# Patient Record
Sex: Female | Born: 1980 | Hispanic: No | Smoking: Never smoker
Health system: Southern US, Community
[De-identification: ages and names within clinical notes are randomized; demographics above are authoritative.]

## PROBLEM LIST (undated history)

## (undated) DIAGNOSIS — N946 Dysmenorrhea, unspecified: Secondary | ICD-10-CM

## (undated) DIAGNOSIS — E079 Disorder of thyroid, unspecified: Secondary | ICD-10-CM

## (undated) DIAGNOSIS — E785 Hyperlipidemia, unspecified: Secondary | ICD-10-CM

## (undated) DIAGNOSIS — C801 Malignant (primary) neoplasm, unspecified: Secondary | ICD-10-CM

## (undated) HISTORY — PX: WISDOM TOOTH EXTRACTION: SHX21

## (undated) HISTORY — DX: Malignant (primary) neoplasm, unspecified: C80.1

## (undated) HISTORY — PX: COLPOSCOPY: SHX161

## (undated) HISTORY — DX: Hyperlipidemia, unspecified: E78.5

## (undated) HISTORY — PX: CERVICAL BIOPSY  W/ LOOP ELECTRODE EXCISION: SUR135

## (undated) HISTORY — PX: OTHER SURGICAL HISTORY: SHX169

## (undated) HISTORY — DX: Disorder of thyroid, unspecified: E07.9

## (undated) HISTORY — DX: Dysmenorrhea, unspecified: N94.6

## (undated) HISTORY — PX: DILATION AND CURETTAGE OF UTERUS: SHX78

---

## 2018-07-13 DIAGNOSIS — R928 Other abnormal and inconclusive findings on diagnostic imaging of breast: Secondary | ICD-10-CM | POA: Diagnosis not present

## 2018-07-13 DIAGNOSIS — N6313 Unspecified lump in the right breast, lower outer quadrant: Secondary | ICD-10-CM | POA: Diagnosis not present

## 2018-07-13 DIAGNOSIS — Z6841 Body Mass Index (BMI) 40.0 and over, adult: Secondary | ICD-10-CM | POA: Diagnosis not present

## 2018-07-13 DIAGNOSIS — N6310 Unspecified lump in the right breast, unspecified quadrant: Secondary | ICD-10-CM | POA: Diagnosis not present

## 2018-07-13 DIAGNOSIS — E669 Obesity, unspecified: Secondary | ICD-10-CM | POA: Diagnosis not present

## 2018-07-13 DIAGNOSIS — N6311 Unspecified lump in the right breast, upper outer quadrant: Secondary | ICD-10-CM | POA: Diagnosis not present

## 2018-07-13 DIAGNOSIS — N6312 Unspecified lump in the right breast, upper inner quadrant: Secondary | ICD-10-CM | POA: Diagnosis not present

## 2018-07-13 DIAGNOSIS — Z139 Encounter for screening, unspecified: Secondary | ICD-10-CM | POA: Diagnosis not present

## 2018-07-14 LAB — HM MAMMOGRAPHY

## 2018-07-20 DIAGNOSIS — Z6841 Body Mass Index (BMI) 40.0 and over, adult: Secondary | ICD-10-CM | POA: Diagnosis not present

## 2018-07-20 DIAGNOSIS — B373 Candidiasis of vulva and vagina: Secondary | ICD-10-CM | POA: Diagnosis not present

## 2018-07-20 DIAGNOSIS — Z124 Encounter for screening for malignant neoplasm of cervix: Secondary | ICD-10-CM | POA: Diagnosis not present

## 2018-07-20 DIAGNOSIS — E669 Obesity, unspecified: Secondary | ICD-10-CM | POA: Diagnosis not present

## 2018-07-20 DIAGNOSIS — Z01411 Encounter for gynecological examination (general) (routine) with abnormal findings: Secondary | ICD-10-CM | POA: Diagnosis not present

## 2018-07-20 DIAGNOSIS — E039 Hypothyroidism, unspecified: Secondary | ICD-10-CM | POA: Diagnosis not present

## 2018-07-20 DIAGNOSIS — R5383 Other fatigue: Secondary | ICD-10-CM | POA: Diagnosis not present

## 2018-07-20 DIAGNOSIS — Z3009 Encounter for other general counseling and advice on contraception: Secondary | ICD-10-CM | POA: Diagnosis not present

## 2018-07-20 DIAGNOSIS — Z139 Encounter for screening, unspecified: Secondary | ICD-10-CM | POA: Diagnosis not present

## 2018-07-28 LAB — HM PAP SMEAR: HM Pap smear: NEGATIVE

## 2018-09-12 ENCOUNTER — Other Ambulatory Visit: Payer: Self-pay

## 2018-09-13 ENCOUNTER — Ambulatory Visit (INDEPENDENT_AMBULATORY_CARE_PROVIDER_SITE_OTHER): Payer: 59 | Admitting: Family Medicine

## 2018-09-13 ENCOUNTER — Encounter: Payer: Self-pay | Admitting: Family Medicine

## 2018-09-13 ENCOUNTER — Telehealth: Payer: Self-pay | Admitting: Obstetrics and Gynecology

## 2018-09-13 VITALS — BP 120/88 | HR 83 | Temp 98.1°F | Ht 65.0 in | Wt 257.8 lb

## 2018-09-13 DIAGNOSIS — Z8639 Personal history of other endocrine, nutritional and metabolic disease: Secondary | ICD-10-CM | POA: Diagnosis not present

## 2018-09-13 DIAGNOSIS — Z8619 Personal history of other infectious and parasitic diseases: Secondary | ICD-10-CM | POA: Diagnosis not present

## 2018-09-13 DIAGNOSIS — R4589 Other symptoms and signs involving emotional state: Secondary | ICD-10-CM

## 2018-09-13 DIAGNOSIS — E049 Nontoxic goiter, unspecified: Secondary | ICD-10-CM | POA: Diagnosis not present

## 2018-09-13 DIAGNOSIS — L292 Pruritus vulvae: Secondary | ICD-10-CM | POA: Diagnosis not present

## 2018-09-13 DIAGNOSIS — E010 Iodine-deficiency related diffuse (endemic) goiter: Secondary | ICD-10-CM | POA: Diagnosis not present

## 2018-09-13 DIAGNOSIS — F419 Anxiety disorder, unspecified: Secondary | ICD-10-CM | POA: Diagnosis not present

## 2018-09-13 DIAGNOSIS — M2550 Pain in unspecified joint: Secondary | ICD-10-CM | POA: Diagnosis not present

## 2018-09-13 DIAGNOSIS — F329 Major depressive disorder, single episode, unspecified: Secondary | ICD-10-CM | POA: Diagnosis not present

## 2018-09-13 DIAGNOSIS — R5383 Other fatigue: Secondary | ICD-10-CM | POA: Diagnosis not present

## 2018-09-13 NOTE — Progress Notes (Signed)
Tina Maldonado is a 38 y.o. female  Chief Complaint  Patient presents with  . Establish Care    est care/hx of HPV-- referral to gyno/ neck swelling fevers on and off/     HPI: Tina Maldonado is a 38 y.o. female here to establish care with our office. sheis a single mom, 36yo daughter. 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 Her previous care was with [facility] and also [facility].  She has a list of multiple concerns/complaints: 1. Pt states she was diagnosed with parotitis in 06/2017, seen at [facility] ER. Pt states her symptoms resolved but since that time she notes 1 spot on Rt and 2 on Lt under jaw/neck. She notes a "pulsitile" sensation in these areas on/off. No swelling. No pain. No fever, chills.  She thinks this is "nodal involvement".   2. Pt states she feels "flushed, feverish" at times but never has a temp.   3. + fatigue (chronic), joints pain, "bone cysts" that come/go and "get inflamed and swollen".   Symptoms are not as pronounced when she is sleeping well, eating well, getting a walk in daily. She gets >7hrs/night of sleep now. She is active at work (>10,000 steps). She does feel, and states her family members have told her they feel, she needs to better treat/manage her anxiety.   She had labs done in 06/2018 - normal thyroid, high cholesterol (not on medication), blood sugar was normal.   Specialists: GI [facility]), endocrinologist, rheumatology  Last PAP: 06/2018 (normal PAP, HPV +) but pt notes h/o abnormal in the past as well as colposcopy - pt requests referral to GYN Last colonoscopy: 2020   Past Medical History:  Diagnosis Date  . Cancer ([place])    basal cell carcinoma  . Hyperlipidemia   . Thyroid disease     Past Surgical History:  Procedure Laterality Date  . 2 colposcopy      Social History   Socioeconomic History  . Marital status: Single    Spouse name: Not on file  . Number of children: Not on file  . Years of education: Not on file  . Highest  education level: Not on file  Occupational History  . Not on file  Social Needs  . Financial resource strain: Not on file  . Food insecurity:    Worry: Not on file    Inability: Not on file  . Transportation needs:    Medical: Not on file    Non-medical: Not on file  Tobacco Use  . Smoking status: Never Smoker  . Smokeless tobacco: Never Used  Substance and Sexual Activity  . Alcohol use: Never    Frequency: Never  . Drug use: Never  . Sexual activity: Not on file  Lifestyle  . Physical activity:    Days per week: Not on file    Minutes per session: Not on file  . Stress: Not on file  Relationships  . Social connections:    Talks on phone: Not on file    Gets together: Not on file    Attends religious service: Not on file    Active member of club or organization: Not on file    Attends meetings of clubs or organizations: Not on file    Relationship status: Not on file  . Intimate partner violence:    Fear of current or ex partner: Not on file    Emotionally abused: Not on file    Physically abused: Not  on file    Forced sexual activity: Not on file  Other Topics Concern  . Not on file  Social History Narrative  . Not on file    Family History  Problem Relation Age of Onset  . Miscarriages / [place] Mother   . Thyroid disease Mother   . Thyroid disease Maternal Grandmother   . Arthritis Maternal Grandmother   . Heart disease Paternal Grandmother   . Heart disease Paternal Grandfather      Immunization History  Administered Date(s) Administered  . Influenza-Unspecified 03/28/2018  . Tdap 06/28/2016    No outpatient encounter medications on file as of 09/13/2018.   No facility-administered encounter medications on file as of 09/13/2018.      ROS: Pertinent positives and negatives noted in HPI. Remainder of ROS non-contributory    Allergies  Allergen Reactions  . Yeast Hives  . Tetanus Toxoids     Hives, swelling  . Pork Allergy Other (See  Comments)    BP 120/88   Pulse 83   Temp 98.1 F (36.7 C) (Oral)   Ht 5\' 5"  (1.651 m)   Wt 257 lb 12.8 oz (116.9 kg)   SpO2 99%   BMI 42.90 kg/m   Physical Exam  Constitutional: She is oriented to person, place, and time. She appears well-developed and well-nourished. No distress.  Neck: Neck supple. Thyromegaly present.  Cardiovascular: Normal rate and regular rhythm.  Pulmonary/Chest: Effort normal and breath sounds normal.  Lymphadenopathy:    She has no cervical adenopathy.  Neurological: She is alert and oriented to person, place, and time.  Skin: Skin is warm and dry.  Psychiatric:  Pt is tearful at times during visit      A/P:  1. History of HPV infection 2. Vulvar itching - Ambulatory referral to Gynecology  3. Fatigue, unspecified type 4. Arthralgia, unspecified joint - Ambulatory referral to Rheumatology  5. H/O thyroid cyst 6. Thyromegaly 7. H/O: hypothyroidism - Ambulatory referral to Endocrinology - US THYROID; Future  9. Anxiety 10. Depressed mood - pt does feel she has symptoms of both anxiety and depression and she feels these may contribute to or exacerbate her physical symptoms. We discussed medication options but pt would like to "think about" and "look into" a few of the meds we discussed and will f/u with me

## 2018-09-13 NOTE — Telephone Encounter (Signed)
Called patient but her voice mail is full. Could not leave a message for patient to call back to schedule a new patient doctor referral appointment with our office to see any provider for:

## 2018-09-14 ENCOUNTER — Ambulatory Visit (INDEPENDENT_AMBULATORY_CARE_PROVIDER_SITE_OTHER): Payer: 59 | Admitting: Obstetrics and Gynecology

## 2018-09-14 ENCOUNTER — Other Ambulatory Visit: Payer: Self-pay

## 2018-09-14 ENCOUNTER — Encounter: Payer: Self-pay | Admitting: Obstetrics and Gynecology

## 2018-09-14 ENCOUNTER — Telehealth: Payer: Self-pay

## 2018-09-14 VITALS — BP 118/82 | HR 76 | Ht 65.75 in | Wt 257.2 lb

## 2018-09-14 DIAGNOSIS — N763 Subacute and chronic vulvitis: Secondary | ICD-10-CM

## 2018-09-14 DIAGNOSIS — Z8742 Personal history of other diseases of the female genital tract: Secondary | ICD-10-CM

## 2018-09-14 DIAGNOSIS — L309 Dermatitis, unspecified: Secondary | ICD-10-CM | POA: Diagnosis not present

## 2018-09-14 DIAGNOSIS — Z9889 Other specified postprocedural states: Secondary | ICD-10-CM | POA: Diagnosis not present

## 2018-09-14 DIAGNOSIS — F3289 Other specified depressive episodes: Secondary | ICD-10-CM

## 2018-09-14 DIAGNOSIS — N898 Other specified noninflammatory disorders of vagina: Secondary | ICD-10-CM

## 2018-09-14 MED ORDER — BETAMETHASONE VALERATE 0.1 % EX OINT: TOPICAL_OINTMENT | CUTANEOUS | 1 refills | Status: DC

## 2018-09-14 MED ORDER — SERTRALINE HCL 50 MG PO TABS: 50.0000 mg | ORAL_TABLET | Freq: Every day | ORAL | 3 refills | Status: DC

## 2018-09-14 NOTE — Telephone Encounter (Signed)
Tina Maldonado, I placed a referral yesterday for thyroid US but pt would like to change location to [facility] in [place], [place] Phone# [phone]. Can you please update the referral? Thanks!  Tina Maldonado, pt and I discussed medication for depression/anxiety and she was going to think about it and get back to me. Message is that pt would like to try Rx. Will send Rx for zoloft 50mg  1 tab daily to pts pharm. She should f/u in 3-4 wks or sooner PRN

## 2018-09-14 NOTE — Patient Instructions (Signed)

## 2018-09-14 NOTE — Addendum Note (Signed)
Addended by: Ronnald Nian on: 09/14/2018 01:22 PM   Modules accepted: Orders

## 2018-09-14 NOTE — Telephone Encounter (Signed)
Copied from [place] [id number]. Topic: Referral - Request for Referral >> Sep 14, 2018 12:49 PM Yvette Rack wrote: Has patient seen PCP for this complaint? yes  *If NO, is insurance requiring patient see PCP for this issue before PCP can refer them? Referral for which specialty: Ultrasound Preferred provider/office: [facility] in [place], [place] Phone# [phone] Reason for referral: ultrasound - Pt stated she is also willing to try the medication that was recommended

## 2018-09-14 NOTE — Telephone Encounter (Signed)
Dr.C please advise

## 2018-09-14 NOTE — Progress Notes (Signed)
38 y.o. G1P1001 Single Other or two or more races Not Hispanic or Latino female here for a consult fro Dr Bryan Lemma for an abnormal pap and vaginal/rectal itching. The patient reports a  [facility]. She has a long h/o abnormal pap's. Has had 2 leep's, last one was in ~2018. She had a normal pap a year after her leep, then her next pap returned with hpv.  She has had the same partner x 6 years. She thinks he has been faithful, they live together. She reports negative STD testing. Not planning on staying with her partner. Bad relationship.  She c/o a several year history itching and irritation around her vagina and the perianal area. She has seen a dermatologist, was treated with a steroid cream for 2 weeks at a time. She does get sweaty. No vaginal d/c, has had yeast infections in the past.  She has tried baby skin care products, nothing has helped.    She would like to have another child in the future.  She had the gardasil series ~2009.    She met with a new Primary yesterday. She had an enlarged thyroid, will need to f/u with Endocrinology. Prior h/o hypothyroidism, hasn't been on medication for years. She does have a h/o thyroid cysts.   She c/o depression, primary has recommended medication, she is considering.    Period Cycle (Days): 28 Period Duration (Days): 4-5 days Period Pattern: Regular Menstrual Flow: Light, Moderate Menstrual Control: Thin pad, Tampon Menstrual Control Change Freq (Hours): changes tampon/pad every 4 hours Dysmenorrhea: (!) Mild Dysmenorrhea Symptoms: Cramping  Patient's last menstrual period was 07/30/2018 (exact date).          Sexually active: Yes.    The current method of family planning is partner with vasectomy.   Exercising: No.  The patient does not participate in regular exercise at present. Smoker:  no  Health Maintenance: Pap:  08/19/2018 WNL with positive HPV History of abnormal Pap:  Yes, colposcopy and  LEEP MMG:  07/2018 WNL Colonoscopy:10/14/2017 WNL TDAP: 2018 Gardasil: completed all 3    reports that she has never smoked. She has never used smokeless tobacco. She reports that she does not drink alcohol or use drugs. Daughter is 74. She is an Geologist, engineering at [employer]. Daughter is 38, staying with her Grandparents ~2 hours away with the Covid 19 outbreak.   Past Medical History:  Diagnosis Date  . Cancer ([place])    basal cell carcinoma  . Dysmenorrhea   . Hyperlipidemia   . Thyroid disease     Past Surgical History:  Procedure Laterality Date  . 2 colposcopy    . CERVICAL BIOPSY  W/ LOOP ELECTRODE EXCISION    . COLPOSCOPY    . DILATION AND CURETTAGE OF UTERUS    . WISDOM TOOTH EXTRACTION      Current Outpatient Medications  Medication Sig Dispense Refill  . cetirizine (ZYRTEC) 10 MG tablet Take 10 mg by mouth as needed for allergies.    . naproxen sodium (ALEVE) 220 MG tablet Take 220 mg by mouth daily as needed.    . OIL OF OREGANO PO Take by mouth daily.     No current facility-administered medications for this visit.     Family History  Problem Relation Age of Onset  . Miscarriages / [place] Mother   . Thyroid disease Mother   . Thyroid disease Maternal Grandmother   . Arthritis Maternal Grandmother   . Heart  disease Paternal Grandmother   . Heart disease Paternal Grandfather     Review of Systems  Constitutional: Negative.   HENT: Negative.   Eyes: Negative.   Respiratory: Negative.   Cardiovascular: Negative.   Gastrointestinal:       Rectal itching  Endocrine: Negative.   Genitourinary:       Vaginal itching  Musculoskeletal: Negative.   Skin: Negative.   Allergic/Immunologic: Negative.   Neurological: Negative.   Hematological: Negative.   Psychiatric/Behavioral: Negative.     Exam:   BP 118/82 (BP Location: Right Arm, Patient Position: Sitting, Cuff Size: Large)   Pulse 76   Ht 5' 5.75" (1.67 m)   Wt 257 lb 3.2 oz (116.7 kg)   LMP 07/30/2018  (Exact Date)   BMI 41.83 kg/m   Weight change: _0 @ Height:   Height: 5' 5.75" (167 cm)  Ht Readings from Last 3 Encounters:  09/14/18 5' 5.75" (1.67 m)  09/13/18 _1  (1.651 m)    General appearance: alert, cooperative and appears stated age Tearful throughout the visit   Pelvic: External genitalia:  no lesions, slight erythema on the lateral vulva.              Urethra:  normal appearing urethra with no masses, tenderness or lesions              Bartholins and Skenes: normal                 Vagina: normal appearing vagina with normal color, there is a slight increase in watery vaginal discharge              Cervix: no lesions               Bimanual Exam:  Uterus:  normal size, contour, position, consistency, mobility, non-tender              Adnexa: no mass, fullness, tenderness   Perianal region with whitening, erythema, c/w lichen simplex chronicus                 Chaperone was present for exam.  A:  Abnormal pap  Vulvitis   Perianal dermatitis  Depression  P:   Will get a copy of her LEEP pathology, and her pap's since then. I suspect she will need a colposcopy  Affirm sent  Discussed vulvar and perianal skin care  Steroid ointment, use BID for up to 2 weeks, then can use up to 2 x a week  Discussed depression, name of counselor given  Her primary discussed antidepressant's with her, I encouraged her to speak with her primary again. I think she would benefit from medication.   Over 40 minutes spent with the patient, over 50% in counseling  CC: Dr Bryan Lemma Note sent

## 2018-09-14 NOTE — Telephone Encounter (Signed)
Left pt VM to call back, need to inform of rx being sent to pharmacy on file. Also, Need to inform pt that she should f/u in 3-4 weeks after taking the medication or sooner PRN.

## 2018-09-15 LAB — VAGINITIS/VAGINOSIS, DNA PROBE
Candida Species: POSITIVE — AB
Gardnerella vaginalis: NEGATIVE
Trichomonas vaginosis: NEGATIVE

## 2018-09-18 ENCOUNTER — Telehealth: Payer: Self-pay | Admitting: Emergency Medicine

## 2018-09-18 MED ORDER — FLUCONAZOLE 150 MG PO TABS: ORAL_TABLET | ORAL | 0 refills | Status: DC

## 2018-09-18 NOTE — Telephone Encounter (Signed)
-----   Message from Salvadore Dom, MD sent at 09/15/2018  1:44 PM EDT ----- Please inform the patient that her vaginitis panel returned with yeast and treat with diflucan 150 mg x 1, may repeat in 72 hours if still symptomatic. #2, no refills

## 2018-09-18 NOTE — Telephone Encounter (Signed)
Message left to return call to [place] at [phone].   Diflucan sent in to [facility], [place], [place]   Mychart message sent as well.

## 2018-09-21 NOTE — Telephone Encounter (Signed)
Sent pt mychart message to inform her about rx sent and that we need to make a f/u appoint in 3-4 weeks.

## 2018-10-04 ENCOUNTER — Encounter: Payer: Self-pay | Admitting: Family Medicine

## 2018-10-13 ENCOUNTER — Ambulatory Visit
Admission: RE | Admit: 2018-10-13 | Discharge: 2018-10-13 | Disposition: A | Discharge status: Home / Self Care | Payer: 59 | Source: Ambulatory Visit | Attending: Family Medicine | Admitting: Family Medicine

## 2018-10-13 ENCOUNTER — Other Ambulatory Visit: Payer: Self-pay

## 2018-10-13 DIAGNOSIS — E041 Nontoxic single thyroid nodule: Secondary | ICD-10-CM | POA: Diagnosis not present

## 2018-10-13 DIAGNOSIS — Z8639 Personal history of other endocrine, nutritional and metabolic disease: Secondary | ICD-10-CM

## 2018-10-13 DIAGNOSIS — E042 Nontoxic multinodular goiter: Secondary | ICD-10-CM | POA: Diagnosis not present

## 2018-10-13 DIAGNOSIS — E049 Nontoxic goiter, unspecified: Secondary | ICD-10-CM

## 2018-10-19 ENCOUNTER — Encounter: Payer: Self-pay | Admitting: Family Medicine

## 2018-11-06 ENCOUNTER — Other Ambulatory Visit: Payer: Self-pay

## 2018-11-06 ENCOUNTER — Ambulatory Visit (INDEPENDENT_AMBULATORY_CARE_PROVIDER_SITE_OTHER): Payer: 59 | Admitting: Internal Medicine

## 2018-11-06 ENCOUNTER — Encounter: Payer: Self-pay | Admitting: Internal Medicine

## 2018-11-06 ENCOUNTER — Telehealth: Payer: Self-pay | Admitting: Internal Medicine

## 2018-11-06 VITALS — BP 126/70 | HR 94 | Temp 98.2°F | Ht 65.0 in | Wt 260.4 lb

## 2018-11-06 DIAGNOSIS — Z6841 Body Mass Index (BMI) 40.0 and over, adult: Secondary | ICD-10-CM

## 2018-11-06 DIAGNOSIS — R5383 Other fatigue: Secondary | ICD-10-CM

## 2018-11-06 DIAGNOSIS — E041 Nontoxic single thyroid nodule: Secondary | ICD-10-CM | POA: Diagnosis not present

## 2018-11-06 DIAGNOSIS — E559 Vitamin D deficiency, unspecified: Secondary | ICD-10-CM | POA: Insufficient documentation

## 2018-11-06 DIAGNOSIS — M2550 Pain in unspecified joint: Secondary | ICD-10-CM | POA: Insufficient documentation

## 2018-11-06 LAB — BASIC METABOLIC PANEL
BUN: 8 mg/dL (ref 6–23)
CO2: 27 mEq/L (ref 19–32)
Calcium: 9.4 mg/dL (ref 8.4–10.5)
Chloride: 104 mEq/L (ref 96–112)
Creatinine, Ser: 0.7 mg/dL (ref 0.40–1.20)
GFR: 93.58 mL/min (ref >60.00)
Glucose, Bld: 104 mg/dL — ABNORMAL HIGH (ref 70–99)
Potassium: 3.8 mEq/L (ref 3.5–5.1)
Sodium: 140 mEq/L (ref 135–145)

## 2018-11-06 LAB — T4, FREE: Free T4: 0.97 ng/dL (ref 0.60–1.60)

## 2018-11-06 LAB — TSH: TSH: 2.35 u[IU]/mL (ref 0.35–4.50)

## 2018-11-06 LAB — VITAMIN D 25 HYDROXY (VIT D DEFICIENCY, FRACTURES): VITD: 18.43 ng/mL — ABNORMAL LOW (ref 30.00–100.00)

## 2018-11-06 MED ORDER — ERGOCALCIFEROL 1.25 MG (50000 UT) PO CAPS: 50000.0000 [IU] | ORAL_CAPSULE | ORAL | 0 refills | Status: DC

## 2018-11-06 NOTE — Telephone Encounter (Signed)
Patient returning call. Please call patient at ph# [phone].

## 2018-11-06 NOTE — Progress Notes (Signed)
Name: Tina Maldonado  MRN/ DOB: [id number], [birth year]    Age/ Sex: 38 y.o., female    PCP: Ronnald Nian, DO   Reason for Endocrinology Evaluation: Thyroid nodules     Date of Initial Endocrinology Evaluation: 11/06/2018     HPI: [name] is a 38 y.o. female with a past medical history of thyroid nodules.  The patient presented for initial endocrinology clinic visit on 11/06/2018 for consultative assistance with her Thyroid nodules.   She has been diagnosed with thyroid nodules ~ 10 yrs ago. She is S/P FNA ~ in 2015 with benign cytology per patient. She does not recall where she had the FNA done at.   She was also diagnosed with Hashimoto's thyroiditis and was on Levothyroxine at some point, but stopped it years ago.   She complaints of fatigue, she sleeps 8 hrs at night but wakes up tired, she admits to snoring. She attributes weight gain to poor lifestyle choices and lack of physical activity.   She does admit to depression, she has not started sertraline, she was tearful in the office today.     Works as Chartered loss adjuster     HISTORY:  Past Medical History:  Past Medical History:  Diagnosis Date  . Cancer ([place])    basal cell carcinoma  . Dysmenorrhea   . Hyperlipidemia   . Thyroid disease    Past Surgical History:  Past Surgical History:  Procedure Laterality Date  . 2 colposcopy    . CERVICAL BIOPSY  W/ LOOP ELECTRODE EXCISION    . COLPOSCOPY    . DILATION AND CURETTAGE OF UTERUS    . WISDOM TOOTH EXTRACTION        Social History:  reports that she has never smoked. She has never used smokeless tobacco. She reports that she does not drink alcohol or use drugs.  Family History: family history includes Arthritis in her maternal grandmother; Heart disease in her paternal grandfather and paternal grandmother; Miscarriages / [place] in her mother; Thyroid disease in her maternal grandmother and mother.   HOME MEDICATIONS: Allergies as of  11/06/2018      Reactions   Yeast Hives   Tetanus Toxoids    Hives, swelling   Pork Allergy Other (See Comments)      Medication List       Accurate as of Nov 06, 2018  1:21 PM. If you have any questions, ask your nurse or doctor.        betamethasone valerate ointment 0.1 % Commonly known as:  VALISONE Use a small amount BID for up to 2 weeks as needed. Not for daily use. You can use it 2 x a week as maintenance   cetirizine 10 MG tablet Commonly known as:  ZYRTEC Take 10 mg by mouth as needed for allergies.   ergocalciferol 1.25 MG (50000 UT) capsule Commonly known as:  VITAMIN D2 Take 1 capsule (50,000 Units total) by mouth once a week. Started by:  Dorita Sciara, MD   fluconazole 150 MG tablet Commonly known as:  DIFLUCAN Take one tablet po today and one tablet po in 72 hours if symptoms remain.   OIL OF OREGANO PO Take by mouth daily.   sertraline 50 MG tablet Commonly known as:  ZOLOFT Take 1 tablet (50 mg total) by mouth daily.         REVIEW OF SYSTEMS: A comprehensive ROS was conducted with the patient and is negative except as per  HPI and below:  Review of Systems  Constitutional: Positive for malaise/fatigue. Negative for weight loss.  HENT: Negative for congestion and sore throat.   Eyes: Negative for blurred vision and pain.  Respiratory: Negative for cough and shortness of breath.   Cardiovascular: Negative for chest pain and palpitations.  Gastrointestinal: Negative for constipation and nausea.  Genitourinary: Negative for frequency.  Musculoskeletal: Positive for joint pain.  Neurological: Positive for tingling. Negative for tremors.  Endo/Heme/Allergies: Positive for polydipsia.  Psychiatric/Behavioral: Positive for depression. The patient is not nervous/anxious.        OBJECTIVE:  VS: BP 126/70 (BP Location: Left Arm, Patient Position: Sitting, Cuff Size: Normal)   Pulse 94   Temp 98.2 F (36.8 C) (Oral)   Wt 260 lb 6.4 oz  (118.1 kg)   SpO2 98%   BMI 42.35 kg/m    Wt Readings from Last 3 Encounters:  11/06/18 260 lb 6.4 oz (118.1 kg)  09/14/18 257 lb 3.2 oz (116.7 kg)  09/13/18 257 lb 12.8 oz (116.9 kg)     EXAM: General: Pt appears well , teary eyed during the visit   Hydration: Well-hydrated with moist mucous membranes and good skin turgor  Eyes: External eye exam normal without stare, lid lag or exophthalmos.  EOM intact.  PERRL.  Ears, Nose, Throat: Hearing: Grossly intact bilaterally Dental: Good dentition  Throat: Clear without mass, erythema or exudate  Neck: General: Supple without adenopathy. Thyroid: Thyroid size normal.  No goiter or nodules appreciated. No thyroid bruit.  Lungs: Clear with good BS bilat with no rales, rhonchi, or wheezes  Heart: Auscultation: RRR.  Abdomen: Normoactive bowel sounds, soft, nontender, without masses or organomegaly palpable  Extremities:  BL LE: No pretibial edema normal ROM and strength.  Skin: Hair: Texture and amount normal with gender appropriate distribution Skin Inspection: Pt with malar rash , no acanthosis nigricans/skin tags. Pt with pale but wide abdominal wall striae. Skin Palpation: Skin temperature, texture, and thickness normal to palpation  Neuro: Cranial nerves: II - XII grossly intact  Motor: Normal strength throughout DTRs: 2+ and symmetric in UE without delay in relaxation phase  Mental Status: Judgment, insight: Intact Orientation: Oriented to time, place, and person Mood and affect: pt tearing during certain conversation      DATA REVIEWED: Results for ABRINA, PETZ (MRN [id number]) as of 11/06/2018 13:19  Ref. Range 11/06/2018 09:01  Sodium Latest Ref Range: 135 - 145 mEq/L 140  Potassium Latest Ref Range: 3.5 - 5.1 mEq/L 3.8  Chloride Latest Ref Range: 96 - 112 mEq/L 104  CO2 Latest Ref Range: 19 - 32 mEq/L 27  Glucose Latest Ref Range: 70 - 99 mg/dL 104 (H)  BUN Latest Ref Range: 6 - 23 mg/dL 8  Creatinine Latest Ref Range:  0.40 - 1.20 mg/dL 0.70  Calcium Latest Ref Range: 8.4 - 10.5 mg/dL 9.4  GFR Latest Ref Range: >60.00 mL/min 93.58  VITD Latest Ref Range: 30.00 - 100.00 ng/mL 18.43 (L)   Results for JEMIMAH, CRESSY (MRN [id number]) as of 11/06/2018 13:19  Ref. Range 11/06/2018 09:01  TSH Latest Ref Range: 0.35 - 4.50 uIU/mL 2.35  T4,Free(Direct) Latest Ref Range: 0.60 - 1.60 ng/dL 0.97      Thyroid Ultrasound 10/13/2018  Right mid thyroid nodule meets criteria for surveillance, as designated by the newly established ACR TI-RADS criteria. Surveillance ultrasound study recommended to be performed annually up to 5 years.  ASSESSMENT/PLAN/RECOMMENDATIONS:   1. Thyroid Nodule :   - Denies local  neck symptoms.  - Pt is biochemically euthyroid - Pt with multiple non-specific symptoms , that are NOT attributed to her thyroid  - Discussed thyroid nodules are 95 % benign.  - Discussed the need for repeat ultrasound in a year of the right superior nodule - Pt has a hx of FNA in 2015 with benign cytology, she does not recall which nodule was biopsied or where it was done.     2. Arthralagia :   - She has been referred to rheumatology by her PCP  - Will check Vitamin D levels today    3. Fatigue :   - This is multifactorial given depression, vitamin d deficiency, OSA. - Phenotypically I have a high suspicion for possible obstructive sleep apnea - I have asked her to address this with her PCP for further evaluation   4. Obesity :   - Pt attributes this to poor lifestyle choices. I have encouraged her to exercise (brisk walking ) for ~ 170 min/week - She may use "myfitness pal" to count calories - If interested she may see Dr. Leafy Ro for medical weight management.  - We also discussed screening for cushing syndrome, but again she believes this is lifestyle, will revisit this in the future.    5.Depression :   - She has not started Sertraline yet. I have advised her to start antidepressants, as  this could improve her motivation, I also assured her that anti-depressants are not a life sentence she could discuss tapering down with her PCP after 6-9 months .   6. Vitamin D deficiency :   Will replenish with Vitamin D 50,000 iu weekly for 3 months, after that she can go to OTC D3 daily 2000 iu daily.     F/u in  1 yr.   Signed electronically by: Mack Guise, MD  [facility] Endocrinology  [facility] [address] [place], [place] [address] Phone: [phone] FAX: [phone]   CC: Ronnald Nian, [place] [address] Phone: [phone] Fax: [phone]   Return to Endocrinology clinic as below: Future Appointments  Date Time Provider [place]  11/06/2019  8:10 AM Storie Heffern, Melanie Crazier, MD [facility] None

## 2018-11-07 LAB — THYROID PEROXIDASE ANTIBODY: Thyroperoxidase Ab SerPl-aCnc: 44 IU/mL — ABNORMAL HIGH (ref <9)

## 2018-11-08 NOTE — Telephone Encounter (Signed)
Nothing further needed 

## 2019-04-22 ENCOUNTER — Encounter (HOSPITAL_COMMUNITY): Payer: Self-pay | Admitting: Emergency Medicine

## 2019-04-22 ENCOUNTER — Emergency Department (HOSPITAL_COMMUNITY)
Admission: EM | Admit: 2019-04-22 | Discharge: 2019-04-22 | Disposition: A | Discharge status: Home / Self Care | Payer: PRIVATE HEALTH INSURANCE | Attending: Emergency Medicine | Admitting: Emergency Medicine

## 2019-04-22 ENCOUNTER — Other Ambulatory Visit: Payer: Self-pay

## 2019-04-22 DIAGNOSIS — Z79899 Other long term (current) drug therapy: Secondary | ICD-10-CM | POA: Diagnosis not present

## 2019-04-22 DIAGNOSIS — M545 Low back pain, unspecified: Secondary | ICD-10-CM

## 2019-04-22 DIAGNOSIS — E785 Hyperlipidemia, unspecified: Secondary | ICD-10-CM | POA: Diagnosis not present

## 2019-04-22 DIAGNOSIS — Z887 Allergy status to serum and vaccine status: Secondary | ICD-10-CM | POA: Insufficient documentation

## 2019-04-22 DIAGNOSIS — E079 Disorder of thyroid, unspecified: Secondary | ICD-10-CM | POA: Insufficient documentation

## 2019-04-22 DIAGNOSIS — Z91018 Allergy to other foods: Secondary | ICD-10-CM | POA: Diagnosis not present

## 2019-04-22 MED ORDER — LIDOCAINE 5 % EX PTCH
1.0000 | MEDICATED_PATCH | CUTANEOUS | Status: DC
  Administered 2019-04-22: 1 via TRANSDERMAL
  Filled 2019-04-22: qty 1

## 2019-04-22 MED ORDER — METHOCARBAMOL 500 MG PO TABS: 500.0000 mg | ORAL_TABLET | Freq: Two times a day (BID) | ORAL | 0 refills | Status: DC

## 2019-04-22 MED ORDER — METHOCARBAMOL 500 MG PO TABS
500.0000 mg | ORAL_TABLET | Freq: Once | ORAL | Status: AC
  Administered 2019-04-22: 500 mg via ORAL
  Filled 2019-04-22: qty 1

## 2019-04-22 MED ORDER — IBUPROFEN 800 MG PO TABS
800.0000 mg | ORAL_TABLET | Freq: Once | ORAL | Status: AC
  Administered 2019-04-22: 800 mg via ORAL
  Filled 2019-04-22: qty 1

## 2019-04-22 NOTE — Discharge Instructions (Signed)
You were seen here today for Back Pain: Low back pain is discomfort in the lower back that may be due to injuries to muscles and ligaments around the spine. Occasionally, it may be caused by a problem to a part of the spine called a disc. Your back pain should be treated with medicines listed below as well as back exercises and this back pain should get better over the next 2 weeks. Most patients get completely well in 4 weeks. It is important to know however, if you develop severe or worsening pain, low back pain with fever, numbness, weakness or inability to walk or urinate, you should return to the ER immediately.  Please follow up with your doctor this week for a recheck if still having symptoms.  HOME INSTRUCTIONS Self - care:  The application of heat can help soothe the pain.  Maintaining your daily activities, including walking (this is encouraged), as it will help you get better faster than just staying in bed. Do not life, push, pull anything more than 10 pounds for the next week. I am attaching back exercises that you can do at home to help facilitate your recovery.   Back Exercises - I have attached a handout on back exercises that can be done at home to help facilitate your recovery.   Medications are also useful to help with pain control.   Acetaminophen.  This medication is generally safe, and found over the counter. Take as directed for your age. You should not take more than 8 of the extra strength (500mg ) pills a day (max dose is 4000mg  total OVER one day)  Non steroidal anti inflammatory: This includes medications including Ibuprofen, naproxen and Mobic; These medications help both pain and swelling and are very useful in treating back pain.  They should be taken with food, as they can cause stomach upset, and more seriously, stomach bleeding. Do not combine the medications.   Lidocaine Patch: Salon Pas lidocaine patches (blue and silver box) can be purchased over the counter and worn  for 12 hours for local pain relief   Muscle relaxants:  These medications can help with muscle tightness that is a cause of lower back pain.  Most of these medications can cause drowsiness, and it is not safe to drive or use dangerous machinery while taking them. They are primarily helpful when taken at night before sleep.  You will need to follow up with your primary healthcare provider in 1-2 weeks for reassessment and persistent symptoms.  Be aware that if you develop new symptoms, such as a fever, leg weakness, difficulty with or loss of control of your urine or bowels, abdominal pain, or more severe pain, you will need to seek medical attention and/or return to the Emergency department. Additional Information:  Your vital signs today were: BP (!) 162/97    Pulse (!) 115    Temp 98.6 F (37 C) (Oral)    Resp 18    LMP 04/22/2019    SpO2 97%  If your blood pressure (BP) was elevated above 135/85 this visit, please have this repeated by your doctor within one month. ---------------

## 2019-04-22 NOTE — ED Provider Notes (Signed)
[facility], [place] EMERGENCY DEPARTMENT Provider Note   CSN: [id number] Arrival date & time: 04/22/19  1946     History   Chief Complaint Chief Complaint  Patient presents with   Back Pain    HPI Tina Maldonado is a 38 y.o. female.     Tina Maldonado is a 38 y.o. female with a history of hyperlipidemia, thyroid disease, who presents to the ED for evaluation of low back pain.  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  She reports pain starts in the middle and radiates out bilaterally.  Pain does not extend into the legs.  She denies any associated numbness, weakness or tingling.  No loss of bowel or bladder control or saddle anesthesia.  Denies previous history of back injury or surgery.  No history of IV drug use or cancer.  No fevers or abdominal pain.  Nothing for pain prior to arrival.  No other aggravating or alleviating factors.     Past Medical History:  Diagnosis Date   Cancer ([place])    basal cell carcinoma   Dysmenorrhea    Hyperlipidemia    Thyroid disease     Patient Active Problem List   Diagnosis Date Noted   Vitamin D deficiency 11/06/2018   Class 3 severe obesity due to excess calories without serious comorbidity with body mass index (BMI) of 40.0 to 44.9 in adult [facility]) 11/06/2018   Fatigue 11/06/2018   Arthralgia 11/06/2018   Thyroid nodule 11/06/2018    Past Surgical History:  Procedure Laterality Date   2 colposcopy     CERVICAL BIOPSY  W/ LOOP ELECTRODE EXCISION     COLPOSCOPY     DILATION AND CURETTAGE OF UTERUS     WISDOM TOOTH EXTRACTION       OB History    Gravida  1   Para  1   Term  1   Preterm      AB      Living  1     SAB      TAB      Ectopic      Multiple      Live Births  1            Home Medications    Prior to Admission medications   Medication Sig Start Date End Date Taking?  Authorizing Provider  betamethasone valerate ointment (VALISONE) 0.1 % Use a small amount BID for up to 2 weeks as needed. Not for daily use. You can use it 2 x a week as maintenance Patient not taking: Reported on 11/06/2018 09/14/18   Tina Dom, MD  cetirizine (ZYRTEC) 10 MG tablet Take 10 mg by mouth as needed for allergies.    [provider]  ergocalciferol (VITAMIN D2) 1.25 MG (50000 UT) capsule Take 1 capsule (50,000 Units total) by mouth once a week. 11/06/18   Tina, Melanie Crazier, MD  fluconazole (DIFLUCAN) 150 MG tablet Take one tablet po today and one tablet po in 72 hours if symptoms remain. Patient not taking: Reported on 11/06/2018 09/18/18   Tina Dom, MD  methocarbamol (ROBAXIN) 500 MG tablet Take 1 tablet (500 mg total) by mouth 2 (two) times daily. 04/22/19   Tina Larsen, PA-C  OIL OF OREGANO PO Take by mouth daily.    [provider]  sertraline (ZOLOFT) 50 MG tablet Take  1 tablet (50 mg total) by mouth daily. Patient not taking: Reported on 11/06/2018 09/14/18   Ronnald Nian, DO    Family History Family History  Problem Relation Age of Onset   5 / [place] Mother    Thyroid disease Mother    Thyroid disease Maternal Grandmother    Arthritis Maternal Grandmother    Heart disease Paternal Grandmother    Heart disease Paternal Grandfather     Social History Social History   Tobacco Use   Smoking status: Never Smoker   Smokeless tobacco: Never Used  Substance Use Topics   Alcohol use: Never    Frequency: Never   Drug use: Never     Allergies   Yeast, Tetanus toxoids, and Pork allergy   Review of Systems Review of Systems  Constitutional: Negative for chills and fever.  HENT: Negative.   Respiratory: Negative for shortness of breath.   Cardiovascular: Negative for chest pain.  Gastrointestinal: Negative for abdominal pain, constipation, diarrhea, nausea and vomiting.    Genitourinary: Negative for dysuria, flank pain, frequency and hematuria.  Musculoskeletal: Positive for back pain. Negative for arthralgias, gait problem, joint swelling, myalgias and neck pain.  Skin: Negative for color change, rash and wound.  Neurological: Negative for weakness and numbness.     Physical Exam Updated Vital Signs BP (!) 162/97    Pulse (!) 115    Temp 98.6 F (37 C) (Oral)    Resp 18    LMP 04/22/2019    SpO2 97%   Physical Exam Vitals signs and nursing note reviewed.  Constitutional:      General: She is not in acute distress.    Appearance: She is well-developed. She is not diaphoretic.  HENT:     Head: Atraumatic.  Eyes:     General:        Right eye: No discharge.        Left eye: No discharge.  Neck:     Musculoskeletal: Neck supple.  Cardiovascular:     Pulses:          Radial pulses are 2+ on the right side and 2+ on the left side.       Dorsalis pedis pulses are 2+ on the right side and 2+ on the left side.       Posterior tibial pulses are 2+ on the right side and 2+ on the left side.  Pulmonary:     Effort: Pulmonary effort is normal. No respiratory distress.  Abdominal:     General: Bowel sounds are normal. There is no distension.     Palpations: Abdomen is soft. There is no mass.     Tenderness: There is no abdominal tenderness. There is no guarding.     Comments: Abdomen soft, nondistended, nontender to palpation in all quadrants without guarding or peritoneal signs, no CVA tenderness bilaterally  Musculoskeletal:     Comments: Tenderness to palpation over low back starting at midline and radiating out.  No overlying skin changes or palpable deformity.  Pain made worse with range of motion of the lower extremities  Skin:    General: Skin is warm and dry.     Capillary Refill: Capillary refill takes less than 2 seconds.  Neurological:     Mental Status: She is alert and oriented to person, place, and time.     Comments: Alert, clear  speech, following commands. Moving all extremities without difficulty. Bilateral lower extremities with 5/5 strength in proximal and distal muscle groups  and with dorsi and plantar flexion. Sensation intact in bilateral lower extremities. 2+ patellar DTRs bilaterally. Ambulatory with steady gait  Psychiatric:        Behavior: Behavior normal.      ED Treatments / Results  Labs (all labs ordered are listed, but only abnormal results are displayed) Labs Reviewed - No data to display  EKG None  Radiology No results found.  Procedures Procedures (including critical care time)  Medications Ordered in ED Medications  lidocaine (LIDODERM) 5 % 1 patch (1 patch Transdermal Patch Applied 04/22/19 2043)  ibuprofen (ADVIL) tablet 800 mg (800 mg Oral Given 04/22/19 2043)  methocarbamol (ROBAXIN) tablet 500 mg (500 mg Oral Given 04/22/19 2042)     Initial Impression / Assessment and Plan / ED Course  I have reviewed the triage vital signs and the nursing notes.  Pertinent labs & imaging results that were available during my care of the patient were reviewed by me and considered in my medical decision making (see chart for details).  Patient with back pain.  Tachycardic on arrival but heart rate returned to normal range with treatment on my exam.  No neurological deficits and normal neuro exam.  Patient can walk but states is painful.  No loss of bowel or bladder control.  No concern for cauda equina.  No fever, night sweats, weight loss, h/o cancer, IVDU.  Pain treated and improved in the ED.  RICE protocol and pain medicine indicated and discussed with patient.   Final Clinical Impressions(s) / ED Diagnoses   Final diagnoses:  Acute midline low back pain without sciatica    ED Discharge Orders         Ordered    methocarbamol (ROBAXIN) 500 MG tablet  2 times daily     04/22/19 2217           Tina Larsen, PA-C 04/22/19 [place], [place], DO 04/25/19 1242

## 2019-04-22 NOTE — ED Notes (Signed)
Patient verbalizes understanding of discharge instructions. Opportunity for questioning and answers were provided. Armband removed by staff, pt discharged from ED.  

## 2019-04-22 NOTE — ED Triage Notes (Signed)
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

## 2019-08-08 ENCOUNTER — Other Ambulatory Visit (HOSPITAL_COMMUNITY)
Admission: RE | Admit: 2019-08-08 | Discharge: 2019-08-08 | Disposition: A | Discharge status: Home / Self Care | Payer: Self-pay | Source: Ambulatory Visit | Attending: Obstetrics and Gynecology | Admitting: Obstetrics and Gynecology

## 2019-08-08 ENCOUNTER — Encounter: Payer: Self-pay | Admitting: Obstetrics and Gynecology

## 2019-08-08 ENCOUNTER — Ambulatory Visit: Payer: PRIVATE HEALTH INSURANCE | Admitting: Obstetrics and Gynecology

## 2019-08-08 ENCOUNTER — Other Ambulatory Visit: Payer: Self-pay

## 2019-08-08 VITALS — BP 110/70 | HR 78 | Temp 97.7°F | Resp 14 | Ht 65.0 in | Wt 241.2 lb

## 2019-08-08 DIAGNOSIS — Z113 Encounter for screening for infections with a predominantly sexual mode of transmission: Secondary | ICD-10-CM | POA: Diagnosis not present

## 2019-08-08 DIAGNOSIS — E559 Vitamin D deficiency, unspecified: Secondary | ICD-10-CM

## 2019-08-08 DIAGNOSIS — Z124 Encounter for screening for malignant neoplasm of cervix: Secondary | ICD-10-CM | POA: Insufficient documentation

## 2019-08-08 DIAGNOSIS — Z01419 Encounter for gynecological examination (general) (routine) without abnormal findings: Secondary | ICD-10-CM

## 2019-08-08 DIAGNOSIS — N763 Subacute and chronic vulvitis: Secondary | ICD-10-CM

## 2019-08-08 DIAGNOSIS — Z0000 Encounter for general adult medical examination without abnormal findings: Secondary | ICD-10-CM

## 2019-08-08 NOTE — Patient Instructions (Signed)

## 2019-08-08 NOTE — Progress Notes (Addendum)
39 y.o. G1P1001 Single Other or two or more races Not Hispanic or Latino female here for annual exam.   She broke up with her boyfriend 9 months ago, had sex with him in 12/21. Then had oral sex with a new partner 07/31/19, she then developed a sore throat.  Period Cycle (Days): 28 Period Duration (Days): 4 Period Pattern: Regular Menstrual Flow: Moderate Menstrual Control: Tampon, Maxi pad, Thin pad, Panty liner Menstrual Control Change Freq (Hours): 4 Dysmenorrhea: None  Patient's last menstrual period was 08/01/2019.          Sexually active: Yes.    The current method of family planning is vasectomy.    Exercising: Yes.    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 Smoker:  no  Health Maintenance: Pap: 07/29/19 Neg + HR HPV  History of abnormal Pap:  yes MMG: 07/14/2018 normal per patient  Colonoscopy: 2019 normal  TDaP:  2018 Gardasil: completed    reports that she has never smoked. She has never used smokeless tobacco. She reports that she does not drink alcohol or use drugs. Just occasional ETOH. She a CT and MRI tech.   Past Medical History:  Diagnosis Date  . Cancer ([place])    basal cell carcinoma  . Dysmenorrhea   . Hyperlipidemia   . Thyroid disease     Past Surgical History:  Procedure Laterality Date  . 2 colposcopy    . CERVICAL BIOPSY  W/ LOOP ELECTRODE EXCISION    . COLPOSCOPY    . DILATION AND CURETTAGE OF UTERUS    . WISDOM TOOTH EXTRACTION      Current Outpatient Medications  Medication Sig Dispense Refill  . betamethasone valerate ointment (VALISONE) 0.1 % Use a small amount BID for up to 2 weeks as needed. Not for daily use. You can use it 2 x a week as maintenance 30 g 1  . cetirizine (ZYRTEC) 10 MG tablet Take 10 mg by mouth as needed for allergies.    Marland Kitchen ergocalciferol (VITAMIN D2) 1.25 MG (50000 UT) capsule Take 1 capsule (50,000 Units total) by mouth once a week. 12 capsule 0  . methocarbamol (ROBAXIN) 500 MG tablet Take 1 tablet (500 mg total)  by mouth 2 (two) times daily. 20 tablet 0  . OIL OF OREGANO PO Take by mouth daily.    . sertraline (ZOLOFT) 50 MG tablet Take 1 tablet (50 mg total) by mouth daily. 90 tablet 3  . clindamycin (CLEOCIN) 300 MG capsule      No current facility-administered medications for this visit.    Family History  Problem Relation Age of Onset  . Miscarriages / [place] Mother   . Thyroid disease Mother   . Thyroid disease Maternal Grandmother   . Arthritis Maternal Grandmother   . Heart disease Paternal Grandmother   . Heart disease Paternal Grandfather     Review of Systems  HENT: Positive for sore throat.     Exam:   BP 110/70 (BP Location: Right Arm, Patient Position: Sitting, Cuff Size: Large)   Pulse 78   Temp 97.7 F (36.5 C) (Skin)   Resp 14   Ht 5\' 5"  (1.651 m)   Wt 241 lb 4 oz (109.4 kg)   LMP 08/01/2019   BMI 40.15 kg/m   Weight change: @WEIGHTCHANGE @ Height:   Height: 5\' 5"  (165.1 cm)  Ht Readings from Last 3 Encounters:  08/08/19 5\' 5"  (1.651 m)  11/06/18 5\' 5"  (1.651 m)  09/14/18 5' 5.75" (1.67  m)    General appearance: alert, cooperative and appears stated age Head: Normocephalic, without obvious abnormality, atraumatic Neck: no adenopathy, supple, symmetrical, trachea midline and thyroid normal to inspection and palpation Lungs: clear to auscultation bilaterally Cardiovascular: regular rate and rhythm Breasts: normal appearance, no masses or tenderness Abdomen: soft, non-tender; non distended,  no masses,  no organomegaly Extremities: extremities normal, atraumatic, no cyanosis or edema Skin: Skin color, texture, turgor normal. No rashes or lesions Lymph nodes: Cervical, supraclavicular, and axillary nodes normal. No abnormal inguinal nodes palpated Neurologic: Grossly normal   Pelvic: External genitalia:  no lesions              Urethra:  normal appearing urethra with no masses, tenderness or lesions              Bartholins and Skenes: normal                  Vagina: normal appearing vagina with normal color and discharge, no lesions              Cervix: no lesions               Bimanual Exam:  Uterus:  normal size, contour, position, consistency, mobility, non-tender              Adnexa: no mass, fullness, tenderness               Rectovaginal: Confirms               Anus:  normal sphincter tone, no lesions  Gae Dry chaperoned for the exam.  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

## 2019-08-10 LAB — CT/GC NAA, PHARYNGEAL
C TRACH RRNA NPH QL PCR: NEGATIVE
N GONORRHOEA RRNA NPH QL PCR: NEGATIVE

## 2019-08-10 LAB — COMPREHENSIVE METABOLIC PANEL
ALT: 13 IU/L (ref 0–32)
AST: 15 IU/L (ref 0–40)
Albumin/Globulin Ratio: 1.7 (ref 1.2–2.2)
Albumin: 4.5 g/dL (ref 3.8–4.8)
Alkaline Phosphatase: 73 IU/L (ref 39–117)
BUN/Creatinine Ratio: 21 (ref 9–23)
BUN: 12 mg/dL (ref 6–20)
Bilirubin Total: <0.2 mg/dL (ref 0.0–1.2)
CO2: 22 mmol/L (ref 20–29)
Calcium: 9.8 mg/dL (ref 8.7–10.2)
Chloride: 105 mmol/L (ref 96–106)
Creatinine, Ser: 0.58 mg/dL (ref 0.57–1.00)
GFR calc Af Amer: 135 mL/min/{1.73_m2} (ref >59)
GFR calc non Af Amer: 117 mL/min/{1.73_m2} (ref >59)
Globulin, Total: 2.7 g/dL (ref 1.5–4.5)
Glucose: 102 mg/dL — ABNORMAL HIGH (ref 65–99)
Potassium: 4.3 mmol/L (ref 3.5–5.2)
Sodium: 142 mmol/L (ref 134–144)
Total Protein: 7.2 g/dL (ref 6.0–8.5)

## 2019-08-10 LAB — CBC
Hematocrit: 45.7 % (ref 34.0–46.6)
Hemoglobin: 15.1 g/dL (ref 11.1–15.9)
MCH: 29.9 pg (ref 26.6–33.0)
MCHC: 33 g/dL (ref 31.5–35.7)
MCV: 91 fL (ref 79–97)
Platelets: 416 10*3/uL (ref 150–450)
RBC: 5.05 x10E6/uL (ref 3.77–5.28)
RDW: 12.3 % (ref 11.7–15.4)
WBC: 10.8 10*3/uL (ref 3.4–10.8)

## 2019-08-10 LAB — CYTOLOGY - PAP
Chlamydia: NEGATIVE
Comment: NEGATIVE
Comment: NEGATIVE
Comment: NEGATIVE
Comment: NORMAL
Diagnosis: NEGATIVE
High risk HPV: POSITIVE — AB
Neisseria Gonorrhea: NEGATIVE
Trichomonas: NEGATIVE

## 2019-08-10 LAB — LIPID PANEL
Chol/HDL Ratio: 4.7 ratio — ABNORMAL HIGH (ref 0.0–4.4)
Cholesterol, Total: 261 mg/dL — ABNORMAL HIGH (ref 100–199)
HDL: 55 mg/dL (ref >39)
LDL Chol Calc (NIH): 136 mg/dL — ABNORMAL HIGH (ref 0–99)
Triglycerides: 388 mg/dL — ABNORMAL HIGH (ref 0–149)
VLDL Cholesterol Cal: 70 mg/dL — ABNORMAL HIGH (ref 5–40)

## 2019-08-10 LAB — HIV ANTIBODY (ROUTINE TESTING W REFLEX): HIV Screen 4th Generation wRfx: NONREACTIVE

## 2019-08-10 LAB — VITAMIN D 25 HYDROXY (VIT D DEFICIENCY, FRACTURES): Vit D, 25-Hydroxy: 19.9 ng/mL — ABNORMAL LOW (ref 30.0–100.0)

## 2019-08-10 LAB — RPR: RPR Ser Ql: NONREACTIVE

## 2019-08-11 LAB — C ALBICANS + C GLABRATA, NAA
Candida albicans, NAA: POSITIVE — AB
Candida glabrata, NAA: NEGATIVE

## 2019-08-13 ENCOUNTER — Telehealth: Payer: Self-pay

## 2019-08-13 DIAGNOSIS — R87810 Cervical high risk human papillomavirus (HPV) DNA test positive: Secondary | ICD-10-CM

## 2019-08-13 DIAGNOSIS — Z8742 Personal history of other diseases of the female genital tract: Secondary | ICD-10-CM

## 2019-08-13 NOTE — Telephone Encounter (Signed)
Spoke to pt. Pt given lab results and recommendations per Dr Talbert Nan. Pt refused Diflucan Rx for +yeast due to " having them all the time and the medication doesn't work"  Pt scheduled for colpo for 08/21/2019 at 9:30am. Pt instructed to take Motrin 800 mg with food and water one hour before procedure. Pt agreeable and verbalized understanding.   Routing to Dr Talbert Nan for review and will close encounter.  Cc: Rosa for [employer]. Orders placed.

## 2019-08-14 ENCOUNTER — Telehealth: Payer: Self-pay | Admitting: Obstetrics and Gynecology

## 2019-08-14 NOTE — Telephone Encounter (Signed)
Call placed to get the correct group number on her current [employer] card.

## 2019-08-15 ENCOUNTER — Encounter: Payer: Self-pay | Admitting: Family Medicine

## 2019-08-15 ENCOUNTER — Telehealth (INDEPENDENT_AMBULATORY_CARE_PROVIDER_SITE_OTHER): Payer: Self-pay | Admitting: Family Medicine

## 2019-08-15 DIAGNOSIS — E782 Mixed hyperlipidemia: Secondary | ICD-10-CM

## 2019-08-15 DIAGNOSIS — E559 Vitamin D deficiency, unspecified: Secondary | ICD-10-CM

## 2019-08-15 NOTE — Progress Notes (Signed)
Virtual Visit via Video Note  I connected with Tina Maldonado on 08/15/19 at  2:30 PM EST by a video enabled telemedicine application and verified that I am speaking with the correct person using two identifiers. Location patient: home Location provider: work Persons participating in the virtual visit: patient, provider  I discussed the limitations of evaluation and management by telemedicine and the availability of in person appointments. The patient expressed understanding and agreed to proceed.  No chief complaint on file.    HPI: Tina Maldonado is a 39 y.o. female who follows with Dr. Sumner Boast for her OB-GYN care. Pt has appt and labs done on 08/18/19. Vit D was low and TG, total and LDL chol were elevated.  Pt has lost 20lbs since 05/29/19 with keto diet. She recently stopped keto diet based on the results. She has been eating heavy cream, dairy.  She was not able to exercise due to back injury (Lt lower back) x months but is now able to be more active/exercise. She does not want any Rx med for chol or TG. She was Rx'd Vit D 50,000IU per wk in 10/2018 which she did take but then has not been taking daily Vit D supplement. She recently restarted 5000IU daily.  Lab Results  Component Value Date   CHOL 261 (H) 08/08/2019   HDL 55 08/08/2019   LDLCALC 136 (H) 08/08/2019   TRIG 388 (H) 08/08/2019   CHOLHDL 4.7 (H) 08/08/2019    Component     Latest Ref Rng & Units 11/06/2018  VITD     30.00 - 100.00 ng/mL 18.43 (L)    Past Medical History:  Diagnosis Date  . Cancer ([place])    basal cell carcinoma  . Dysmenorrhea   . Hyperlipidemia   . Thyroid disease     Past Surgical History:  Procedure Laterality Date  . 2 colposcopy    . CERVICAL BIOPSY  W/ LOOP ELECTRODE EXCISION    . COLPOSCOPY    . DILATION AND CURETTAGE OF UTERUS    . WISDOM TOOTH EXTRACTION      Family History  Problem Relation Age of Onset  . Miscarriages / [place] Mother   . Thyroid disease Mother   .  Thyroid disease Maternal Grandmother   . Arthritis Maternal Grandmother   . Heart disease Paternal Grandmother   . Heart disease Paternal Grandfather     Social History   Tobacco Use  . Smoking status: Never Smoker  . Smokeless tobacco: Never Used  Substance Use Topics  . Alcohol use: Never  . Drug use: Never     Current Outpatient Medications:  .  betamethasone valerate ointment (VALISONE) 0.1 %, Use a small amount BID for up to 2 weeks as needed. Not for daily use. You can use it 2 x a week as maintenance, Disp: 30 g, Rfl: 1 .  cetirizine (ZYRTEC) 10 MG tablet, Take 10 mg by mouth as needed for allergies., Disp: , Rfl:  .  clindamycin (CLEOCIN) 300 MG capsule, , Disp: , Rfl:  .  ergocalciferol (VITAMIN D2) 1.25 MG (50000 UT) capsule, Take 1 capsule (50,000 Units total) by mouth once a week., Disp: 12 capsule, Rfl: 0 .  methocarbamol (ROBAXIN) 500 MG tablet, Take 1 tablet (500 mg total) by mouth 2 (two) times daily., Disp: 20 tablet, Rfl: 0 .  OIL OF OREGANO PO, Take by mouth daily., Disp: , Rfl:  .  sertraline (ZOLOFT) 50 MG tablet, Take 1 tablet (50 mg  total) by mouth daily., Disp: 90 tablet, Rfl: 3  Allergies  Allergen Reactions  . Yeast Hives  . Tetanus Toxoids     Hives, swelling  . Pork Allergy Other (See Comments)      ROS: See pertinent positives and negatives per HPI.   EXAM:  VITALS per patient if applicable: LMP [id number]    GENERAL: alert, oriented, appears well and in no acute distress  HEENT: atraumatic, conjunctiva clear, no obvious abnormalities on inspection of external nose and ears  NECK: normal movements of the head and neck  LUNGS: on inspection no signs of respiratory distress, breathing rate appears normal, no obvious gross SOB, gasping or wheezing, no conversational dyspnea  CV: no obvious cyanosis  PSYCH/NEURO: pleasant and cooperative, speech and thought processing grossly intact   ASSESSMENT AND PLAN:  1. Vitamin D deficiency -  cont Vit D 5,000IU daily - recheck Vit D in 6 mo  2. Mixed hyperlipidemia - low fat diet, regular CV exercise (goal of 150 min per week divided over 3-5 days) - recheck FLP in 6 mo  Discussed plan and reviewed medications with patient, including risks, benefits, and potential side effects. Pt expressed understand. All questions answered.   I discussed the assessment and treatment plan with the patient. The patient was provided an opportunity to ask questions and all were answered. The patient agreed with the plan and demonstrated an understanding of the instructions.   The patient was advised to call back or seek an in-person evaluation if the symptoms worsen or if the condition fails to improve as anticipated.   Letta Median, DO

## 2019-08-17 NOTE — Telephone Encounter (Signed)
Second call placed to get current insurance group number.

## 2019-08-20 NOTE — Telephone Encounter (Signed)
Call to patient. Per DPR, OK to leave message on voicemail.  Left voicemail requesting a return call regarding updated insurance information for scheduled colposcopy tomorrow with Dr. Talbert Nan.

## 2019-08-21 ENCOUNTER — Ambulatory Visit: Payer: PRIVATE HEALTH INSURANCE | Admitting: Obstetrics and Gynecology

## 2019-08-29 ENCOUNTER — Ambulatory Visit: Payer: PRIVATE HEALTH INSURANCE | Admitting: Obstetrics and Gynecology

## 2019-08-29 ENCOUNTER — Encounter: Payer: Self-pay | Admitting: Obstetrics and Gynecology

## 2019-08-29 NOTE — Telephone Encounter (Signed)
Patient dnka her colposcopy appointment today. I left a message for her to call and reschedule. [facility] policy followed.

## 2019-09-19 ENCOUNTER — Ambulatory Visit: Payer: 59 | Admitting: Obstetrics and Gynecology

## 2019-09-24 ENCOUNTER — Telehealth: Payer: Self-pay

## 2019-09-24 NOTE — Telephone Encounter (Signed)
Left message for pt to reschedule colpo and pt can speak with Colletta Maryland, RN in triage.

## 2019-09-24 NOTE — Telephone Encounter (Signed)
erroneous encounter Closed 

## 2019-10-31 ENCOUNTER — Telehealth: Payer: Self-pay

## 2019-10-31 NOTE — Telephone Encounter (Signed)
Encounter closed

## 2019-10-31 NOTE — Telephone Encounter (Signed)
Patient called to reschedule her colpo from 08/29/19.

## 2019-10-31 NOTE — Telephone Encounter (Signed)
That is totally fine.

## 2019-10-31 NOTE — Telephone Encounter (Signed)
Spoke with patient. Calling to schedule colpo. 08/08/19 pap negative, positive hpv. Hx of abnormal pap. LMP 10/20/19. SA, partner vasectomy.   Colpo scheduled for 11/08/19 at 11am with Dr. Talbert Nan. Patient declined earlier appt. Advised patient I will review with provider and return call if any additional recommendations. Advised to take Motrin 800 mg with food and water one hour before procedure. Patient verbalizes understanding and is agreeable.   Dr. Talbert Nan -please review. Spouse vasectomy, ok to proceed as scheduled?   Cc: Wilson Singer, Rpsa Rosana Hoes

## 2019-11-06 ENCOUNTER — Ambulatory Visit: Payer: 59 | Admitting: Internal Medicine

## 2019-11-06 NOTE — Progress Notes (Signed)
GYNECOLOGY  VISIT   HPI: 39 y.o.   Single Other or two or more races Not Hispanic or Latino  female   G1P1001 with Patient's last menstrual period was 10/19/2019.   here for a coloposcopy. She has had 2 leeps, last in ~2018, pap in 2019 was negative. Last 2 paps with HPV    Pap History:  08/08/19 Neg + HR HPV 07/29/19 Neg + HR HPV   Last period was was unusual, she spotted for one day, skipped a few days then had a more normal cycle, bleed for almost a week. Typically bleeds for 4-5 days. Normal flow.  Right prior to her cycle, she did some heavy lifting, then had some spontaneous rectal bleeding, no pain, no BM with it.   She c/o long term perianal pruritus. Has 1-2 soft BM's a day  GYNECOLOGIC HISTORY: Patient's last menstrual period was 10/19/2019. Contraception:vasectomy Menopausal hormone therapy: none        OB History    Gravida  1   Para  1   Term  1   Preterm      AB      Living  1     SAB      TAB      Ectopic      Multiple      Live Births  1              Patient Active Problem List   Diagnosis Date Noted  . Vitamin D deficiency 11/06/2018  . Class 3 severe obesity due to excess calories without serious comorbidity with body mass index (BMI) of 40.0 to 44.9 in adult ([place]) 11/06/2018  . Fatigue 11/06/2018  . Arthralgia 11/06/2018  . Thyroid nodule 11/06/2018    Past Medical History:  Diagnosis Date  . Cancer ([place])    basal cell carcinoma  . Dysmenorrhea   . Hyperlipidemia   . Thyroid disease     Past Surgical History:  Procedure Laterality Date  . 2 colposcopy    . CERVICAL BIOPSY  W/ LOOP ELECTRODE EXCISION    . COLPOSCOPY    . DILATION AND CURETTAGE OF UTERUS    . WISDOM TOOTH EXTRACTION      Current Outpatient Medications  Medication Sig Dispense Refill  . betamethasone valerate ointment (VALISONE) 0.1 % Use a small amount BID for up to 2 weeks as needed. Not for daily use. You can use it 2 x a week as maintenance 30 g 1  .  cetirizine (ZYRTEC) 10 MG tablet Take 10 mg by mouth as needed for allergies.    Marland Kitchen ergocalciferol (VITAMIN D2) 1.25 MG (50000 UT) capsule Take 1 capsule (50,000 Units total) by mouth once a week. 12 capsule 0  . OIL OF OREGANO PO Take by mouth daily.    . methocarbamol (ROBAXIN) 500 MG tablet Take 1 tablet (500 mg total) by mouth 2 (two) times daily. (Patient not taking: Reported on 11/08/2019) 20 tablet 0   No current facility-administered medications for this visit.     ALLERGIES: Yeast, Tetanus toxoids, and Pork allergy  Family History  Problem Relation Age of Onset  . Miscarriages / [place] Mother   . Thyroid disease Mother   . Thyroid disease Maternal Grandmother   . Arthritis Maternal Grandmother   . Heart disease Paternal Grandmother   . Heart disease Paternal Grandfather     Social History   Socioeconomic History  . Marital status: Single    Spouse name: Not on  file  . Number of children: Not on file  . Years of education: Not on file  . Highest education level: Not on file  Occupational History  . Not on file  Tobacco Use  . Smoking status: Never Smoker  . Smokeless tobacco: Never Used  Substance and Sexual Activity  . Alcohol use: Never  . Drug use: Never  . Sexual activity: Yes    Birth control/protection: Surgical    Comment: vasectomy  Other Topics Concern  . Not on file  Social History Narrative  . Not on file   Social Determinants of Health   Financial Resource Strain:   . Difficulty of Paying Living Expenses:   Food Insecurity:   . Worried About Charity fundraiser in the Last Year:   . Arboriculturist in the Last Year:   Transportation Needs:   . Film/video editor (Medical):   Marland Kitchen Lack of Transportation (Non-Medical):   Physical Activity:   . Days of Exercise per Week:   . Minutes of Exercise per Session:   Stress:   . Feeling of Stress :   Social Connections:   . Frequency of Communication with Friends and Family:   . Frequency of  Social Gatherings with Friends and Family:   . Attends Religious Services:   . Active Member of Clubs or Organizations:   . Attends Archivist Meetings:   Marland Kitchen Marital Status:   Intimate Partner Violence:   . Fear of Current or Ex-Partner:   . Emotionally Abused:   Marland Kitchen Physically Abused:   . Sexually Abused:     Review of Systems  Constitutional: Negative.   HENT: Negative.   Eyes: Negative.   Respiratory: Negative.   Cardiovascular: Negative.   Gastrointestinal: Negative.   Genitourinary: Negative.   Musculoskeletal: Negative.   Skin: Negative.   Neurological: Negative.   Endo/Heme/Allergies: Negative.   Psychiatric/Behavioral: Negative.     PHYSICAL EXAMINATION:    BP 136/70 (BP Location: Right Arm, Patient Position: Sitting, Cuff Size: Large)   Pulse 84   Temp 98.7 F (37.1 C) (Temporal)   Ht 5\' 5"  (1.651 m)   Wt 241 lb (109.3 kg)   LMP 10/19/2019   SpO2 98%   BMI 40.10 kg/m     General appearance: alert, cooperative and appears stated age  Pelvic: External genitalia:  no lesions              Urethra:  normal appearing urethra with no masses, tenderness or lesions              Bartholins and Skenes: normal                 Vagina: normal appearing vagina with normal color and discharge, no lesions              Cervix: no lesions              Anus:  Fissure at 12  Perianal skin: dermatitis, whitening, thickening  Colposcopy: unsatisfactory, no aceto-white changes. Negative lugols examination of the cervix, there is only one area of decreased lugols uptake in the upper vagina at 5 o'clock. ECC done, vaginal biopsy done, biopsy site treated with silver nitrate.   Chaperone was present for exam.  ASSESSMENT Long H/O HPV, s/p leep x 2. +HPV last 2 years with normal paps Anal fissure H/O rectal bleeding several weeks ago x 1 after heavy lifting, suspect it was from her fissure Perianal dermatitis One episode  of spotting prior to her cycle     PLAN Colposcopy with vaginal biopsy/ECC. Further plan depending on results. Anal pap done (discussed that there are no formal guidelines) Treat perianal dermatitis with steroid ointment, reviewed skin care, use of Vaseline. Information given Information on anal fissure given (mychart) Calendar bleeding

## 2019-11-06 NOTE — Progress Notes (Deleted)
Name: Tina Maldonado  MRN/ DOB: [id number], 1981/04/22    Age/ Sex: 39 y.o., female     PCP: Ronnald Nian, DO   Reason for Endocrinology Evaluation: Right thyroid nodule      Initial Endocrinology Clinic Visit: 11/06/2018    PATIENT IDENTIFIER: Tina Maldonado is a 39 y.o., female with a past medical history of right thyroid nodule . She has followed with [facility] since 11/06/2018 for consultative assistance with management of her right thyroid nodule .   HISTORICAL SUMMARY: The patient was first diagnosed with MNG ~ 2010.  She is S/P FNA ~ in 2015 with benign cytology per patient. She does not recall where she had the FNA done , so unable to obtain these records.   She was also diagnosed with Hashimoto's thyroiditis and was on Levothyroxine at some point, but stopped it years ago.   Works as Chartered loss adjuster  SUBJECTIVE:   Today (11/06/2019):  Ms. Meroney is here for a follow up on thyroid nodules.    ROS:  As per HPI.   HISTORY:  Past Medical History:  Past Medical History:  Diagnosis Date  . Cancer ([place])    basal cell carcinoma  . Dysmenorrhea   . Hyperlipidemia   . Thyroid disease     Past Surgical History:  Past Surgical History:  Procedure Laterality Date  . 2 colposcopy    . CERVICAL BIOPSY  W/ LOOP ELECTRODE EXCISION    . COLPOSCOPY    . DILATION AND CURETTAGE OF UTERUS    . WISDOM TOOTH EXTRACTION       Social History:  reports that she has never smoked. She has never used smokeless tobacco. She reports that she does not drink alcohol or use drugs. Family History:  Family History  Problem Relation Age of Onset  . Miscarriages / [place] Mother   . Thyroid disease Mother   . Thyroid disease Maternal Grandmother   . Arthritis Maternal Grandmother   . Heart disease Paternal Grandmother   . Heart disease Paternal Grandfather       HOME MEDICATIONS: Allergies as of 11/06/2019      Reactions   Yeast Hives   Tetanus  Toxoids    Hives, swelling   Pork Allergy Other (See Comments)      Medication List       Accurate as of Nov 06, 2019  7:49 AM. If you have any questions, ask your nurse or doctor.        betamethasone valerate ointment 0.1 % Commonly known as: VALISONE Use a small amount BID for up to 2 weeks as needed. Not for daily use. You can use it 2 x a week as maintenance   cetirizine 10 MG tablet Commonly known as: ZYRTEC Take 10 mg by mouth as needed for allergies.   clindamycin 300 MG capsule Commonly known as: CLEOCIN   ergocalciferol 1.25 MG (50000 UT) capsule Commonly known as: VITAMIN D2 Take 1 capsule (50,000 Units total) by mouth once a week.   methocarbamol 500 MG tablet Commonly known as: ROBAXIN Take 1 tablet (500 mg total) by mouth 2 (two) times daily.   OIL OF OREGANO PO Take by mouth daily.   sertraline 50 MG tablet Commonly known as: ZOLOFT Take 1 tablet (50 mg total) by mouth daily.         OBJECTIVE:   PHYSICAL EXAM: VS: There were no vitals taken for this visit.   EXAM: General: Pt appears  well and is in NAD  Hydration: Well-hydrated with moist mucous membranes and good skin turgor  Eyes: External eye exam normal without stare, lid lag or exophthalmos.  EOM intact.  PERRL.  Ears, Nose, Throat: Hearing: Grossly intact bilaterally Dental: Good dentition  Throat: Clear without mass, erythema or exudate  Neck: General: Supple without adenopathy. Thyroid: Thyroid size normal.  No goiter or nodules appreciated. No thyroid bruit.  Lungs: Clear with good BS bilat with no rales, rhonchi, or wheezes  Heart: Auscultation: RRR.  Abdomen: Normoactive bowel sounds, soft, nontender, without masses or organomegaly palpable  Extremities: Gait and station: Normal gait  Digits and nails: No clubbing, cyanosis, petechiae, or nodes Head and neck: Normal alignment and mobility BL UE: Normal ROM and strength. BL LE: No pretibial edema normal ROM and strength.    Skin: Hair: Texture and amount normal with gender appropriate distribution Skin Inspection: No rashes, acanthosis nigricans/skin tags. No lipohypertrophy Skin Palpation: Skin temperature, texture, and thickness normal to palpation  Neuro: Cranial nerves: II - XII grossly intact  Cerebellar: Normal coordination and movement; no tremor Motor: Normal strength throughout DTRs: 2+ and symmetric in UE without delay in relaxation phase  Mental Status: Judgment, insight: Intact Orientation: Oriented to time, place, and person Memory: Intact for recent and remote events Mood and affect: No depression, anxiety, or agitation     DATA REVIEWED: *** Results for SELVA, NOONER (MRN [id number]) as of 11/06/2019 07:52  Ref. Range 11/06/2018 09:01  Thyroperoxidase Ab SerPl-aCnc Latest Ref Range: <9 IU/mL 44 (H)     ASSESSMENT / PLAN / RECOMMENDATIONS:   1. Thyroid Nodules:  Plan:  ***    Medications   2. Hashimoto's disease:    Signed electronically by: Mack Guise, MD  [facility] End[facility]  [address] [place], [place] [address] Phone: [phone] FAX: [phone]      CC: Ronnald Nian, [place] [address] Phone: [phone]  Fax: [phone]   Return to Endocrinology clinic as below: Future Appointments  Date Time Provider [place]  11/06/2019  8:10 AM Idaly Verret, Melanie Crazier, MD [facility] None  11/08/2019 11:00 AM Salvadore Dom, MD [place] None  11/08/2019  2:00 PM Ronnald Nian, DO [facility]

## 2019-11-07 ENCOUNTER — Other Ambulatory Visit: Payer: Self-pay

## 2019-11-08 ENCOUNTER — Other Ambulatory Visit (HOSPITAL_COMMUNITY)
Admission: RE | Admit: 2019-11-08 | Discharge: 2019-11-08 | Disposition: A | Discharge status: Home / Self Care | Payer: PRIVATE HEALTH INSURANCE | Source: Ambulatory Visit | Attending: Obstetrics and Gynecology | Admitting: Obstetrics and Gynecology

## 2019-11-08 ENCOUNTER — Ambulatory Visit (INDEPENDENT_AMBULATORY_CARE_PROVIDER_SITE_OTHER): Payer: PRIVATE HEALTH INSURANCE | Admitting: Obstetrics and Gynecology

## 2019-11-08 ENCOUNTER — Ambulatory Visit (INDEPENDENT_AMBULATORY_CARE_PROVIDER_SITE_OTHER): Payer: PRIVATE HEALTH INSURANCE | Admitting: Family Medicine

## 2019-11-08 ENCOUNTER — Encounter: Payer: Self-pay | Admitting: Family Medicine

## 2019-11-08 ENCOUNTER — Encounter: Payer: Self-pay | Admitting: Obstetrics and Gynecology

## 2019-11-08 ENCOUNTER — Ambulatory Visit: Payer: Self-pay

## 2019-11-08 VITALS — BP 136/70 | HR 84 | Temp 98.7°F | Ht 65.0 in | Wt 241.0 lb

## 2019-11-08 VITALS — BP 140/100 | HR 116 | Temp 98.4°F | Ht 65.0 in | Wt 252.6 lb

## 2019-11-08 DIAGNOSIS — N923 Ovulation bleeding: Secondary | ICD-10-CM

## 2019-11-08 DIAGNOSIS — R87810 Cervical high risk human papillomavirus (HPV) DNA test positive: Secondary | ICD-10-CM | POA: Diagnosis present

## 2019-11-08 DIAGNOSIS — K602 Anal fissure, unspecified: Secondary | ICD-10-CM

## 2019-11-08 DIAGNOSIS — L309 Dermatitis, unspecified: Secondary | ICD-10-CM | POA: Diagnosis not present

## 2019-11-08 DIAGNOSIS — G8929 Other chronic pain: Secondary | ICD-10-CM

## 2019-11-08 DIAGNOSIS — Z01812 Encounter for preprocedural laboratory examination: Secondary | ICD-10-CM | POA: Diagnosis not present

## 2019-11-08 DIAGNOSIS — Z1212 Encounter for screening for malignant neoplasm of rectum: Secondary | ICD-10-CM

## 2019-11-08 DIAGNOSIS — D171 Benign lipomatous neoplasm of skin and subcutaneous tissue of trunk: Secondary | ICD-10-CM | POA: Diagnosis not present

## 2019-11-08 DIAGNOSIS — M79671 Pain in right foot: Secondary | ICD-10-CM

## 2019-11-08 DIAGNOSIS — Z85828 Personal history of other malignant neoplasm of skin: Secondary | ICD-10-CM

## 2019-11-08 LAB — POCT URINE PREGNANCY: Preg Test, Ur: NEGATIVE

## 2019-11-08 MED ORDER — BETAMETHASONE VALERATE 0.1 % EX OINT: TOPICAL_OINTMENT | CUTANEOUS | 1 refills | Status: DC

## 2019-11-08 NOTE — Progress Notes (Signed)
Tina Maldonado is a 39 y.o. female  Chief Complaint  Patient presents with  . Rectal Pain    Pt c/o bleeding from rectum, x 4 weeks. Bright red blood when it first started,.  Pt went to GYN today and Dr. Michela Pitcher that pt had a fissure.      HPI: Tina Maldonado is a 39 y.o. female who complains of chronic Rt foot pain, worse when she stands or walks. She is cautious on stairs and usually leads with Lt foot. She would like xray of Rt foot to see if her arch has collapsed. No new or worsening swelling.   She would like a referral to derm d/t h/o basal cell scalp.  Lump on Lt abdomen, not painful. No change in size.   Past Medical History:  Diagnosis Date  . Cancer ([place])    basal cell carcinoma  . Dysmenorrhea   . Hyperlipidemia   . Thyroid disease     Past Surgical History:  Procedure Laterality Date  . 2 colposcopy    . CERVICAL BIOPSY  W/ LOOP ELECTRODE EXCISION    . COLPOSCOPY    . DILATION AND CURETTAGE OF UTERUS    . WISDOM TOOTH EXTRACTION      Social History   Socioeconomic History  . Marital status: Single    Spouse name: Not on file  . Number of children: Not on file  . Years of education: Not on file  . Highest education level: Not on file  Occupational History  . Not on file  Tobacco Use  . Smoking status: Never Smoker  . Smokeless tobacco: Never Used  Substance and Sexual Activity  . Alcohol use: Never  . Drug use: Never  . Sexual activity: Yes    Birth control/protection: Surgical    Comment: vasectomy  Other Topics Concern  . Not on file  Social History Narrative  . Not on file   Social Determinants of Health   Financial Resource Strain:   . Difficulty of Paying Living Expenses:   Food Insecurity:   . Worried About Charity fundraiser in the Last Year:   . Arboriculturist in the Last Year:   Transportation Needs:   . Film/video editor (Medical):   Marland Kitchen Lack of Transportation (Non-Medical):   Physical Activity:   . Days of Exercise per  Week:   . Minutes of Exercise per Session:   Stress:   . Feeling of Stress :   Social Connections:   . Frequency of Communication with Friends and Family:   . Frequency of Social Gatherings with Friends and Family:   . Attends Religious Services:   . Active Member of Clubs or Organizations:   . Attends Archivist Meetings:   Marland Kitchen Marital Status:   Intimate Partner Violence:   . Fear of Current or Ex-Partner:   . Emotionally Abused:   Marland Kitchen Physically Abused:   . Sexually Abused:     Family History  Problem Relation Age of Onset  . Miscarriages / [place] Mother   . Thyroid disease Mother   . Thyroid disease Maternal Grandmother   . Arthritis Maternal Grandmother   . Heart disease Paternal Grandmother   . Heart disease Paternal Grandfather      Immunization History  Administered Date(s) Administered  . Influenza-Unspecified 03/28/2018  . Tdap 06/28/2016    Outpatient Encounter Medications as of 11/08/2019  Medication Sig  . betamethasone valerate ointment (VALISONE) 0.1 % Use a  small amount BID for up to 2 weeks as needed. Not for daily use. You can use it 2 x a week as maintenance  . cetirizine (ZYRTEC) 10 MG tablet Take 10 mg by mouth as needed for allergies.  Marland Kitchen ergocalciferol (VITAMIN D2) 1.25 MG (50000 UT) capsule Take 1 capsule (50,000 Units total) by mouth once a week.  . OIL OF OREGANO PO Take by mouth daily.  . [DISCONTINUED] methocarbamol (ROBAXIN) 500 MG tablet Take 1 tablet (500 mg total) by mouth 2 (two) times daily. (Patient not taking: Reported on 11/08/2019)   No facility-administered encounter medications on file as of 11/08/2019.     ROS: Pertinent positives and negatives noted in HPI. Remainder of ROS non-contributory   Allergies  Allergen Reactions  . Yeast Hives  . Tetanus Toxoids     Hives, swelling  . Pork Allergy Other (See Comments)    BP (!) 140/100 (BP Location: Left Arm, Patient Position: Sitting, Cuff Size: Normal)   Pulse (!)  116   Temp 98.4 F (36.9 C) (Temporal)   Ht 5\' 5"  (1.651 m)   Wt 252 lb 9.6 oz (114.6 kg)   LMP 10/19/2019   SpO2 97%   BMI 42.03 kg/m   Physical Exam  Constitutional: She is oriented to person, place, and time. She appears well-developed and well-nourished. No distress.  Musculoskeletal:        General: No deformity or edema. Normal range of motion.  Neurological: She is alert and oriented to person, place, and time.  Skin:     Psychiatric: She has a normal mood and affect. Her behavior is normal.     A/P:  1. History of basal cell carcinoma - Ambulatory referral to Dermatology - [facility] of scalp in 2017/2018 - overdue for f/u  2. 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 Lipoma of torso - reassured pt of benign nature and no further eval needed    This visit occurred during the SARS-CoV-2 public health emergency.  Safety protocols were in place, including screening questions prior to the visit, additional usage of staff PPE, and extensive cleaning of exam room while observing appropriate contact time as indicated for disinfecting solutions.

## 2019-11-08 NOTE — Patient Instructions (Signed)
Colposcopy Post-procedure Instructions . Cramping is common.  You may take Ibuprofen, Aleve, or Tylenol for the cramping.  This should resolve within the next two to three days.   . You may have bright red spotting or blackish discharge for several days after your procedure.  The discharge occurs because of a topical solution used to stop bleeding at the biopsy site(s).  You should wear a mini pad for the next few days. . Refrain from putting anything in the vagina until the bleeding and/or discharge stops (usually less than a week). . You need to call the office if you have any pelvic pain, fever, heavy bleeding, or foul smelling vaginal discharge. . Shower or bathe as normal . You will be notified within one week of your biopsy results or we will discuss your results at your follow-up appointment if needed.  Anal Fissure, Adult  An anal fissure is a small tear or crack in the tissue of the anus. Bleeding from a fissure usually stops on its own within a few minutes. However, bleeding will often occur again with each bowel movement until the fissure heals. What are the causes? This condition is usually caused by passing a large or hard stool (feces). Other causes include:  Constipation.  Frequent diarrhea.  Inflammatory bowel disease (Crohn's disease or ulcerative colitis).  Childbirth.  Infections.  Anal sex. What are the signs or symptoms? Symptoms of this condition include:  Bleeding from the rectum.  Small amounts of blood seen on your stool, on the toilet paper, or in the toilet after a bowel movement. The blood coats the outside of the stool and is not mixed with the stool.  Painful bowel movements.  Itching or irritation around the anus. How is this diagnosed? A health care provider may diagnose this condition by closely examining the anal area. An anal fissure can usually be seen with careful inspection. In some cases, a rectal exam may be performed, or a short tube  (anoscope) may be used to examine the anal canal. How is this treated? Initial treatment for this condition may include:  Taking steps to avoid constipation. This may include making changes to your diet, such as increasing your intake of fiber or fluid.  Taking fiber supplements. These supplements can soften your stool to help make bowel movements easier. Your health care provider may also prescribe a stool softener if your stool is hard.  Taking sitz baths. This may help to heal the tear.  Using medicated creams or ointments. These may be prescribed to lessen discomfort. Treatments that are sometimes used if initial treatments do not work well or if the condition is more severe may include:  Botulinum injection.  Surgery to repair the fissure. Follow these instructions at home: Eating and drinking   Avoid foods that may cause constipation, such as bananas, milk, and other dairy products.  Eat all fruits, except bananas.  Drink enough fluid to keep your urine pale yellow.  Eat foods that are high in fiber, such as beans, whole grains, and fresh fruits and vegetables. General instructions   Take over-the-counter and prescription medicines only as told by your health care provider.  Use creams or ointments only as told by your health care provider.  Keep the anal area clean and dry.  Take sitz baths as told by your health care provider. Do not use soap in the sitz baths.  Keep all follow-up visits as told by your health care provider. This is important. Contact a health  care provider if you have:  More bleeding.  A fever.  Diarrhea that is mixed with blood.  Pain that continues.  Ongoing problems that are getting worse rather than better. Summary  An anal fissure is a small tear or crack in the tissue of the anus. This condition is usually caused by passing a large or hard stool (feces). Other causes include constipation and frequent diarrhea.  Initial treatment  for this condition may include taking steps to avoid constipation, such as increasing your intake of fiber or fluid.  Follow instructions for care as told by your health care provider.  Contact your health care provider if you have more bleeding or your problem is getting worse rather than better.  Keep all follow-up visits as told by your health care provider. This is important. This information is not intended to replace advice given to you by your health care provider. Make sure you discuss any questions you have with your health care provider. Document Revised: 11/24/2017 Document Reviewed: 11/24/2017 Elsevier Patient Education  [place].  Perianal Dermatitis, Adult Perianal dermatitis is inflammation of the skin around the anus. This condition causes patches of red, irritated skin that may be itchy or painful. It can be passed from one person to another (contagious), depending on what caused it. What are the causes? This condition may be caused by:  Contact with an irritant, such as stool, urine, mucus, soap, or sweat.  Bacteria, especially certain types of strep (streptococcal) bacteria.  Hemorrhoids.  Fungus.  Skin conditions, such as psoriasis.  Medical conditions, such as diabetes and Crohn disease.  Certain foods.  An opening between the rectum and the skin around the anus (fistula). What increases the risk? This condition is more likely to develop in:  People who are unable to control when they go to the bathroom (have incontinence).  People who have trouble walking or moving around (mobility problems).  People with thin or fragile skin.  People taking oral antibiotic medicines or steroids. What are the signs or symptoms? Itchy, red patches of skin are the main symptom of this condition. The skin in this area may also be swollen and have:  Cracks or tears (fissures).  Small, raised bumps (papules).  Small, pus-filled blisters (pustules). Other  symptoms include:  Pain when passing stools.  Blood in the stool.  Itching around the anus.  Tenderness around the anus.  Holding back stools to avoid pain (constipation). How is this diagnosed? This condition is diagnosed with a medical history and physical exam. The diagnosis is confirmed by testing a sample of the skin for bacteria or fungus. How is this treated? Treatment for this condition depends on the cause. Mild cases may go away without treatment when contact with the irritant is stopped. Treatment for more severe cases of perianal dermatitis may include medicines, such as:  A waterproof barrier cream.  Topical or oral corticosteroids to ease skin inflammation.  Antihistamines to relieve itching.  Antibiotics to treat a bacterial infection.  Antifungal cream to treat a fungal infection. Severe fungal infections may also be treated with topical steroids and medicine to control itching. Follow these instructions at home:   Take over-the-counter and prescription medicines only as told by your health care provider.  Apply prescribed or suggested creams only as told by your health care provider.  If you were prescribed an antibiotic, take it as told by your health care provider. Do not stop taking the antibiotic even if your condition improves.  Do not scratch the affected area.  Wash your hands carefully with soap and warm water after each time that you use the bathroom. If soap and water are not available, use hand sanitizer. Make sure other people in your household wash their hands frequently as well.  Keep all follow-up visits as told by your health care provider. This is important. How is this prevented?  Avoid contact with any substances that cause perianal inflammation.  Keep the perianal area clean and dry. Contact a health care provider if:  Your symptoms do not improve with treatment.  Your symptoms get worse.  Your symptoms go away and then  return.  You are not able to control your bowel movements or you are leaking stool (have fecal incontinence).  You have a fever. Get help right away if:  You frequently pass blood in your stools.  You lose weight and you do not know why.  You have fluid, blood, or pus coming from the rash site. Summary  Perianal dermatitis is inflammation of the skin around the anus. This condition causes patches of red, irritated skin that may be itchy or painful.  Itchy, red patches of skin are the main symptom of this condition.  Treatment for this condition depends on the cause. Mild cases may go away without treatment when contact with the irritant is stopped. Treatment for more severe cases of perianal dermatitis may include medicines.  Wash your hands carefully with soap and warm water after each time that you use the bathroom. If soap and water are not available, use hand sanitizer.  Keep the perianal area clean and dry to prevent this condition. This information is not intended to replace advice given to you by your health care provider. Make sure you discuss any questions you have with your health care provider. Document Revised: 05/27/2017 Document Reviewed: 08/26/2016 Elsevier Patient Education  [place].

## 2019-11-08 NOTE — Addendum Note (Signed)
Addended by: Lynnea Ferrier on: 11/08/2019 02:50 PM   Modules accepted: Orders

## 2019-11-12 LAB — SURGICAL PATHOLOGY

## 2019-11-13 ENCOUNTER — Telehealth: Payer: Self-pay

## 2019-11-13 LAB — CYTOLOGY - PAP
Comment: NEGATIVE
Diagnosis: NEGATIVE
High risk HPV: NEGATIVE

## 2019-11-13 NOTE — Telephone Encounter (Signed)
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 I would recommend she have another pap in one year. We don't treat low grade vaginal dysplasia and if that persists she will need f/u colposcopies. The anal pap is pending.

## 2019-11-13 NOTE — Telephone Encounter (Addendum)
Spoke with pt. Pt given results and recommendations per Dr Talbert Nan. Pt agreeable and verbalized understanding. Pt scheduled for next Pap smear with AEX on 11/05/20 at 1030 am. Pt also given normal anal pap results. Pt verbalized understanding.  Routing to Dr Talbert Nan for review.  Encounter closed.

## 2019-11-14 ENCOUNTER — Other Ambulatory Visit: Payer: Self-pay | Admitting: Family Medicine

## 2019-11-14 DIAGNOSIS — G8929 Other chronic pain: Secondary | ICD-10-CM

## 2019-11-14 DIAGNOSIS — M7731 Calcaneal spur, right foot: Secondary | ICD-10-CM

## 2019-11-14 DIAGNOSIS — M79671 Pain in right foot: Secondary | ICD-10-CM

## 2019-11-21 DIAGNOSIS — H906 Mixed conductive and sensorineural hearing loss, bilateral: Secondary | ICD-10-CM | POA: Insufficient documentation

## 2019-11-21 DIAGNOSIS — H7292 Unspecified perforation of tympanic membrane, left ear: Secondary | ICD-10-CM | POA: Insufficient documentation

## 2020-01-23 NOTE — Telephone Encounter (Signed)
Pt had colposcopy on 11/08/19. Encounter closed.

## 2020-06-02 ENCOUNTER — Other Ambulatory Visit: Payer: Self-pay | Admitting: Family Medicine

## 2020-06-03 NOTE — Telephone Encounter (Signed)
Last OV 11/08/19 Medication discontinued 09/14/18

## 2020-08-12 ENCOUNTER — Other Ambulatory Visit: Payer: Self-pay

## 2020-08-13 ENCOUNTER — Encounter: Payer: Self-pay | Admitting: Family Medicine

## 2020-08-13 ENCOUNTER — Ambulatory Visit: Payer: PRIVATE HEALTH INSURANCE | Admitting: Family Medicine

## 2020-08-13 ENCOUNTER — Telehealth (INDEPENDENT_AMBULATORY_CARE_PROVIDER_SITE_OTHER): Payer: PRIVATE HEALTH INSURANCE | Admitting: Family Medicine

## 2020-08-13 VITALS — Ht 65.0 in

## 2020-08-13 DIAGNOSIS — K111 Hypertrophy of salivary gland: Secondary | ICD-10-CM | POA: Diagnosis not present

## 2020-08-13 DIAGNOSIS — Z8639 Personal history of other endocrine, nutritional and metabolic disease: Secondary | ICD-10-CM | POA: Insufficient documentation

## 2020-08-13 NOTE — Progress Notes (Signed)
[facility] [address] [place] [address] Dept: [phone] Dept Fax: [phone]  Virtual Video Visit  I connected with Tina Maldonado on 08/13/20 at  4:00 PM EST by a video enabled telemedicine application and verified that I am speaking with the correct person using two identifiers.  Location patient: Home Location provider: Clinic Persons participating in the virtual visit: Patient, Provider  I discussed the limitations of evaluation and management by telemedicine and the availability of in person appointments. The patient expressed understanding and agreed to proceed.  Chief Complaint  Patient presents with  . Adenopathy    C/O lymph nodes at jawbone patient noticed about 3 months ago. Little pain that come and go.     SUBJECTIVE:  HPI: Tina Maldonado is a 40 y.o. female who presents with a complaint of bilateral enlargement of glands under her jaws. She noted these about a month ago. These have not been tender. She thought these might be lymph nodes. Tina Maldonado has a past history of parotiditis (2019) and states that since that time, she feels she does pass salivary stones and often has a sour taste in her mouth. Tina Maldonado additionally notes an issue with left ear pain that she thinks relates to grinding her teeth. She is using a bite block and feels this has improved those symptoms some. She denies any fever. She denies cavities, but admits to some cold intolerance of some teeth. It has been more than a year since she last saw a dentist. She does admit to dry mouth, esp. When she is dehydrated.  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 She has read about HPV causing throat cancers. She also notes that episodically she notes blood when wiping after urinating. She denies any discharge or rash. She wonders if this might be kidney stones.  Tina Maldonado also notes she has a past history  of Hashimoto's thyroiditis, which has since resolved. She has  had thyroid nodules which were biopsied and appear to be benign. She notes there was some question at one point as to whether she might have Still's disease.  Patient Active Problem List   Diagnosis Date Noted  . History of Hashimoto thyroiditis 08/13/2020  . Mixed conductive and sensorineural hearing loss of both ears 11/21/2019  . Perforation of left tympanic membrane 11/21/2019  . Vitamin D deficiency 11/06/2018  . Class 3 severe obesity due to excess calories without serious comorbidity with body mass index (BMI) of 40.0 to 44.9 in adult ([place]) 11/06/2018  . Fatigue 11/06/2018  . Arthralgia 11/06/2018  . Thyroid nodule 11/06/2018   Past Surgical History:  Procedure Laterality Date  . 2 colposcopy    . CERVICAL BIOPSY  W/ LOOP ELECTRODE EXCISION    . COLPOSCOPY    . DILATION AND CURETTAGE OF UTERUS    . WISDOM TOOTH EXTRACTION     Family History  Problem Relation Age of Onset  . Miscarriages / [place] Mother   . Thyroid disease Mother   . Thyroid disease Maternal Grandmother   . Arthritis Maternal Grandmother   . Heart disease Paternal Grandmother   . Heart disease Paternal Grandfather    Social History   Tobacco Use  . Smoking status: Never Smoker  . Smokeless tobacco: Never Used  Vaping Use  . Vaping Use: Never used  Substance Use Topics  . Alcohol use: Never  . Drug use: Never  Current Outpatient Medications:  .  betamethasone valerate ointment (VALISONE) 0.1 %, Use a small amount BID for up to 2 weeks as needed. Not for daily use. You can use it 2 x a week as maintenance, Disp: 30 g, Rfl: 1 .  ergocalciferol (VITAMIN D2) 1.25 MG (50000 UT) capsule, Take 1 capsule (50,000 Units total) by mouth once a week., Disp: 12 capsule, Rfl: 0 .  Multiple Vitamin (MULTIVITAMIN PO), Take by mouth., Disp: , Rfl:  .  OIL OF OREGANO PO, Take by mouth daily., Disp: , Rfl:  .  cetirizine (ZYRTEC) 10 MG tablet, Take  10 mg by mouth as needed for allergies. (Patient not taking: Reported on 08/13/2020), Disp: , Rfl:   Allergies  Allergen Reactions  . Yeast Hives  . Tetanus Toxoids     Hives, swelling  . Pork Allergy Other (See Comments)    ROS: See pertinent positives and negatives per HPI.  OBSERVATIONS/OBJECTIVE:  VITALS per patient if applicable: Today's Vitals   08/13/20 1605  Height: _0  (1.651 m)   Body mass index is 42.03 kg/m.   GENERAL: alert, oriented, appears well and in no acute distress  HEENT: atraumatic, conjunctiva clear, no obvious abnormalities on inspection of external nose and ears  NECK: normal movements of the head and neck. No visible swelling.  PSYCH/NEURO: Pleasant and cooperative. Patient did become tearful as she discussed her mutiple health issues.  ASSESSMENT AND PLAN:  1. Salivary gland enlargement From her description of the current enlarged areas and her past history of parotiditis, I suspect that she has enlargement of her salivary glands. However, I cannot fully evaluate this through a video visit, so recommend she schedule an in-person visit. I believe she may need further work-up for possible Sjogren's syndrome (CBC, CMP, UA, ESR, anti SSA and anti SSB antibodies) and possibly salivary gland ultrasound.   I discussed the assessment and treatment plan with the patient. The patient was provided an opportunity to ask questions and all were answered. The patient agreed with the plan and demonstrated an understanding of the instructions.  Haydee Salter, MD

## 2020-08-21 ENCOUNTER — Other Ambulatory Visit: Payer: Self-pay

## 2020-08-22 ENCOUNTER — Ambulatory Visit: Payer: PRIVATE HEALTH INSURANCE | Admitting: Family Medicine

## 2020-08-26 ENCOUNTER — Other Ambulatory Visit: Payer: Self-pay

## 2020-08-27 ENCOUNTER — Ambulatory Visit: Payer: PRIVATE HEALTH INSURANCE | Admitting: Family Medicine

## 2020-08-27 ENCOUNTER — Encounter: Payer: Self-pay | Admitting: Family Medicine

## 2020-08-27 VITALS — BP 138/82 | HR 105 | Temp 97.5°F | Ht 65.0 in | Wt 252.2 lb

## 2020-08-27 DIAGNOSIS — Z8639 Personal history of other endocrine, nutritional and metabolic disease: Secondary | ICD-10-CM

## 2020-08-27 DIAGNOSIS — K111 Hypertrophy of salivary gland: Secondary | ICD-10-CM | POA: Diagnosis not present

## 2020-08-27 DIAGNOSIS — N870 Mild cervical dysplasia: Secondary | ICD-10-CM | POA: Insufficient documentation

## 2020-08-27 LAB — URINALYSIS, ROUTINE W REFLEX MICROSCOPIC
Bilirubin Urine: NEGATIVE
Ketones, ur: NEGATIVE
Leukocytes,Ua: NEGATIVE
Nitrite: NEGATIVE
Specific Gravity, Urine: <=1.005 — AB (ref 1.000–1.030)
Total Protein, Urine: NEGATIVE
Urine Glucose: NEGATIVE
Urobilinogen, UA: 0.2 (ref 0.0–1.0)
pH: 6.5 (ref 5.0–8.0)

## 2020-08-27 LAB — COMPREHENSIVE METABOLIC PANEL
ALT: 14 U/L (ref 0–35)
AST: 14 U/L (ref 0–37)
Albumin: 4.8 g/dL (ref 3.5–5.2)
Alkaline Phosphatase: 75 U/L (ref 39–117)
BUN: 6 mg/dL (ref 6–23)
CO2: 25 mEq/L (ref 19–32)
Calcium: 10 mg/dL (ref 8.4–10.5)
Chloride: 103 mEq/L (ref 96–112)
Creatinine, Ser: 0.64 mg/dL (ref 0.40–1.20)
GFR: 110.91 mL/min (ref >60.00)
Glucose, Bld: 93 mg/dL (ref 70–99)
Potassium: 3.8 mEq/L (ref 3.5–5.1)
Sodium: 137 mEq/L (ref 135–145)
Total Bilirubin: 0.5 mg/dL (ref 0.2–1.2)
Total Protein: 7.9 g/dL (ref 6.0–8.3)

## 2020-08-27 LAB — CBC
HCT: 43.6 % (ref 36.0–46.0)
Hemoglobin: 14.8 g/dL (ref 12.0–15.0)
MCHC: 33.9 g/dL (ref 30.0–36.0)
MCV: 87 fl (ref 78.0–100.0)
Platelets: 413 10*3/uL — ABNORMAL HIGH (ref 150.0–400.0)
RBC: 5.01 Mil/uL (ref 3.87–5.11)
RDW: 13.3 % (ref 11.5–15.5)
WBC: 8.6 10*3/uL (ref 4.0–10.5)

## 2020-08-27 LAB — SEDIMENTATION RATE: Sed Rate: 43 mm/hr — ABNORMAL HIGH (ref 0–20)

## 2020-08-27 NOTE — Progress Notes (Signed)
[facility] [address] [place] [address] Dept: [phone] Dept Fax: [phone]  Acute Office Visit  Subjective:    Patient ID: Tina Maldonado, female    DOB: 21-Jan-1981, 40 y.o..   MRN: [id number]  Chief Complaint  Patient presents with  . Follow-up    Follow up on nodules still present, no pain.     History of Present Illness:  Patient is in today for with a continued complaint of bilateral enlargement of glands behind the angle of her jaw. She noted these about a six weeks ago. These have not been tender. She thought these might be lymph nodes. Ms. Chamberlain has a past history of parotiditis (2019).  She last saw her endocrinologist 2 years ago for assessment of this.  Ms. Coke additionally notes an issue with left ear pain that she thinks relates to grinding her teeth. She is using a bite block and feels this has improved those symptoms some. She denies any fever, though she can feel subjectively flushed/feverish. She does not necessarily feel she has dry mouth, though does note dry eye symptoms at times.  Past Medical History: Patient Active Problem List   Diagnosis Date Noted  . Mild dysplasia of cervix (CIN I) 08/27/2020  . History of Hashimoto thyroiditis 08/13/2020  . Mixed conductive and sensorineural hearing loss of both ears 11/21/2019  . Perforation of left tympanic membrane 11/21/2019  . Vitamin D deficiency 11/06/2018  . Class 3 severe obesity due to excess calories without serious comorbidity with body mass index (BMI) of 40.0 to 44.9 in adult ([place]) 11/06/2018  . Fatigue 11/06/2018  . Arthralgia 11/06/2018  . Thyroid nodule 11/06/2018   Past Surgical History:  Procedure Laterality Date  . 2 colposcopy    . CERVICAL BIOPSY  W/ LOOP ELECTRODE EXCISION    . COLPOSCOPY    . DILATION AND CURETTAGE OF UTERUS    . WISDOM TOOTH EXTRACTION     Family History  Problem Relation Age of Onset  . Miscarriages /  [place] Mother   . Thyroid disease Mother   . Thyroid disease Maternal Grandmother   . Arthritis Maternal Grandmother   . Heart disease Paternal Grandmother   . Heart disease Paternal Grandfather    Outpatient Medications Prior to Visit  Medication Sig Dispense Refill  . betamethasone valerate ointment (VALISONE) 0.1 % Use a small amount BID for up to 2 weeks as needed. Not for daily use. You can use it 2 x a week as maintenance 30 g 1  . cetirizine (ZYRTEC) 10 MG tablet Take 10 mg by mouth as needed for allergies.    Marland Kitchen ergocalciferol (VITAMIN D2) 1.25 MG (50000 UT) capsule Take 1 capsule (50,000 Units total) by mouth once a week. 12 capsule 0  . Multiple Vitamin (MULTIVITAMIN PO) Take by mouth.    . OIL OF OREGANO PO Take by mouth daily.    . tacrolimus (PROTOPIC) 0.1 % ointment SMARTSIG:1 Topical Every Night (Patient not taking: Reported on 08/27/2020)     No facility-administered medications prior to visit.   Allergies  Allergen Reactions  . Yeast Hives  . Tetanus Toxoids     Hives, swelling  . Pork Allergy Other (See Comments)   Objective:   Today's Vitals   08/27/20 0810  BP: 138/82  Pulse: (!) 105  Temp: (!) 97.5 F (36.4 C)  TempSrc: Temporal  SpO2: 99%  Weight: 252 lb 3.2 oz (114.4 kg)  Height: 5\' 5"  (  1.651 m)   Body mass index is 41.97 kg/m.   General: Well developed, well nourished. No acute distress. HEENT: Normocephalic, non-traumatic. Conjunctiva clear. I do see some injected blood vessels over the sclera.   External ears normal. EAC normal bilaterally. Left TM has a layered appearance, but is intact. Mucous membranes moist.   Oropharynx clear. Good dentition. Neck: Supple. No lymphadenopathy. The patient can demonstrate for me the glands she is concerned about. These feel like   the tails of the parotid glands. They are non tender, but may be enlarged. No thyromegaly. Psych: Alert and oriented. Normal mood and affect.  Health Maintenance Due  Topic  Date Due  . Hepatitis C Screening  Never done  . COVID-19 Vaccine (3 - Moderna risk 4-dose series) 04/21/2020     Assessment & Plan:   1. Salivary gland enlargement Due to the concern over gland enlargement and the history of other autoimmune disease, I will do a work-up to determine if the current issues could be related to Sjogren's syndrome. I will plan to see her back in 2 weeks for reassessment.  - CBC - Comprehensive metabolic panel - Urinalysis, Routine w reflex microscopic - Sedimentation rate - Sjogrens syndrome-A extractable nuclear antibody - Sjogrens syndrome-B extractable nuclear antibody  2. History of Hashimoto thyroiditis I will reassess her thyroid panel. I also recommended she plan to follow-up with her endocrinologist to reassess this as well.  - Thyroid Panel With TSH  Haydee Salter, MD

## 2020-08-28 LAB — THYROID PANEL WITH TSH
Free Thyroxine Index: 2.5 (ref 1.4–3.8)
T3 Uptake: 27 % (ref 22–35)
T4, Total: 9.3 ug/dL (ref 5.1–11.9)
TSH: 3.66 mIU/L

## 2020-08-28 LAB — SJOGRENS SYNDROME-A EXTRACTABLE NUCLEAR ANTIBODY: SSA (Ro) (ENA) Antibody, IgG: <1 AI

## 2020-08-28 LAB — SJOGRENS SYNDROME-B EXTRACTABLE NUCLEAR ANTIBODY: SSB (La) (ENA) Antibody, IgG: <1 AI

## 2020-09-03 ENCOUNTER — Telehealth: Payer: Self-pay

## 2020-09-03 DIAGNOSIS — K111 Hypertrophy of salivary gland: Secondary | ICD-10-CM

## 2020-09-03 NOTE — Telephone Encounter (Signed)
Patient notified VIA phone that referral was placed and will be contacted soon.  Dm/cma

## 2020-09-03 NOTE — Telephone Encounter (Signed)
Per last lab results you recommended ENT referral  Can you place this for her? Please review and advise.  Thanks. Dm/cma

## 2020-09-03 NOTE — Telephone Encounter (Signed)
Pt called an said Dr Gena Fray recommended she sees an ENT. She is asking a referral be made and she doesn't have preference of location.  Thank you

## 2020-09-07 IMAGING — DX DG FOOT COMPLETE 3+V*R*
3 series · 3 of 3 positions shown · non-contrast
Comparison: None.

CLINICAL DATA: Chronic pain

EXAM:
RIGHT FOOT COMPLETE - 3+ VIEW

[foot ap]
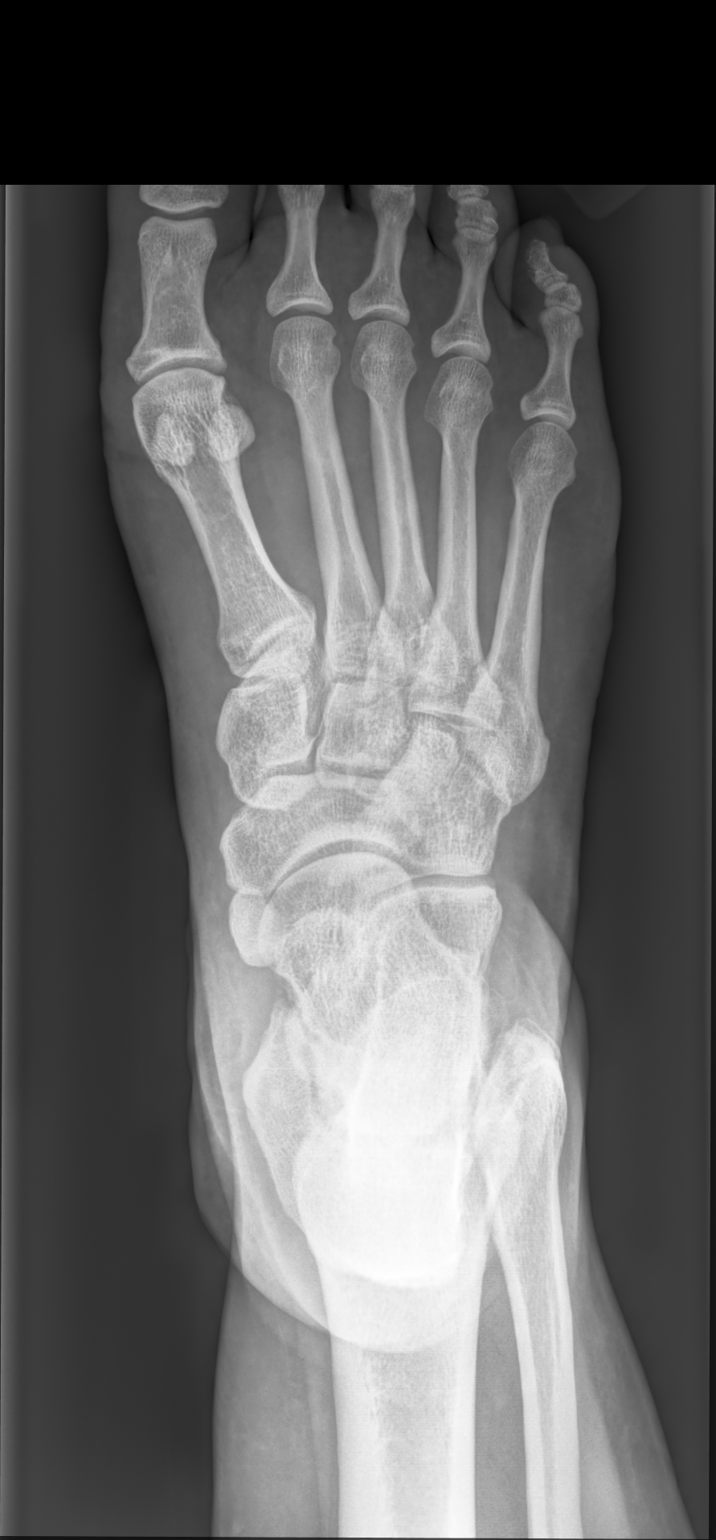

[foot mlo]
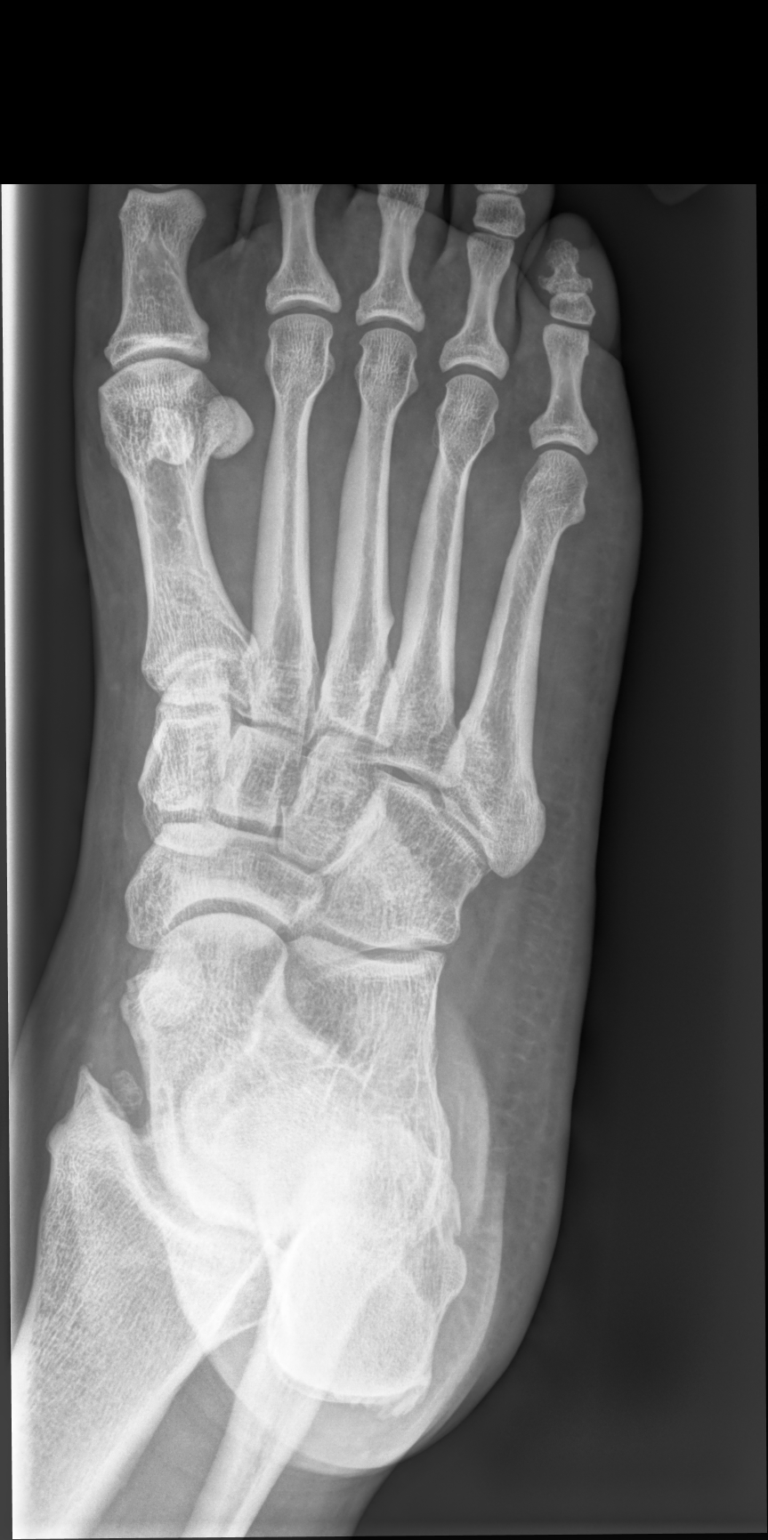

[foot lat]
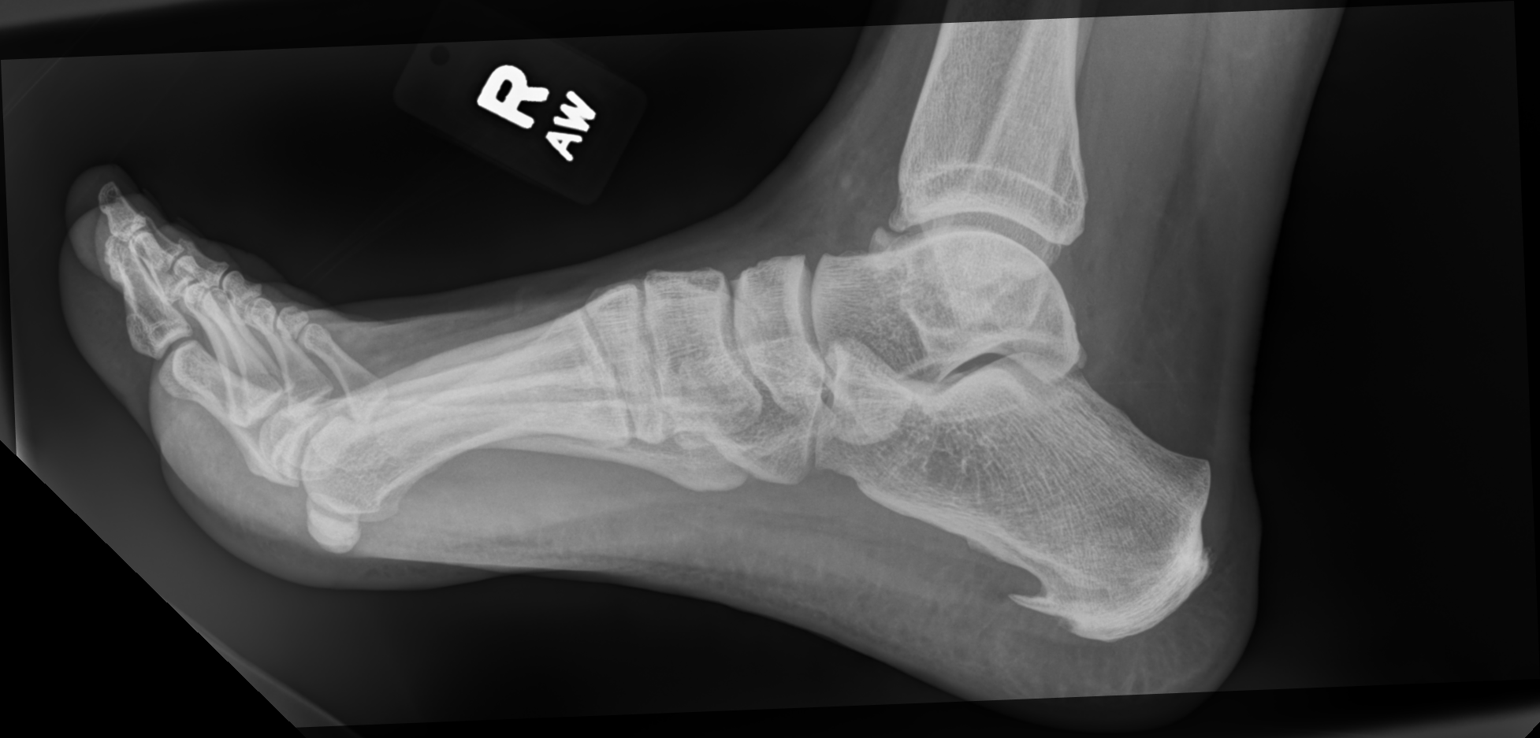

[3 of 3 positions shown; findings below may reference images not displayed]

FINDINGS: Frontal, oblique, and lateral views were obtained. No fracture or
dislocation. Joint spaces appear normal. No erosive change. There is
an inferior calcaneal spur.
IMPRESSION: Inferior calcaneal spur. No fracture or dislocation. No evident
arthropathy.

## 2020-09-07 IMAGING — DX DG ANKLE COMPLETE 3+V*R*
3 series · 3 of 3 positions shown · non-contrast
Comparison: None.

CLINICAL DATA: Chronic pain

EXAM:
RIGHT ANKLE - COMPLETE 3+ VIEW

[ankle ap]
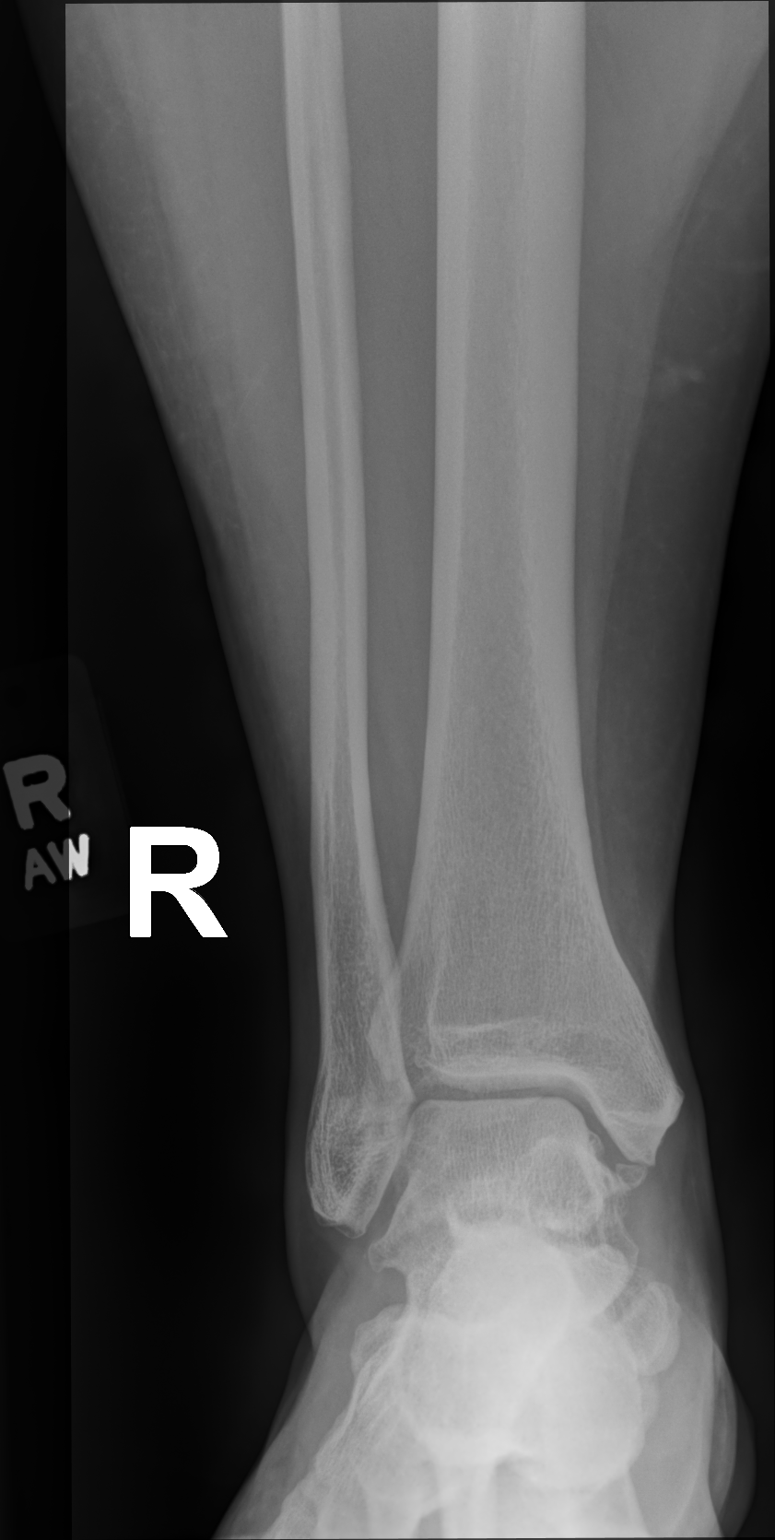

[ankle mlo]
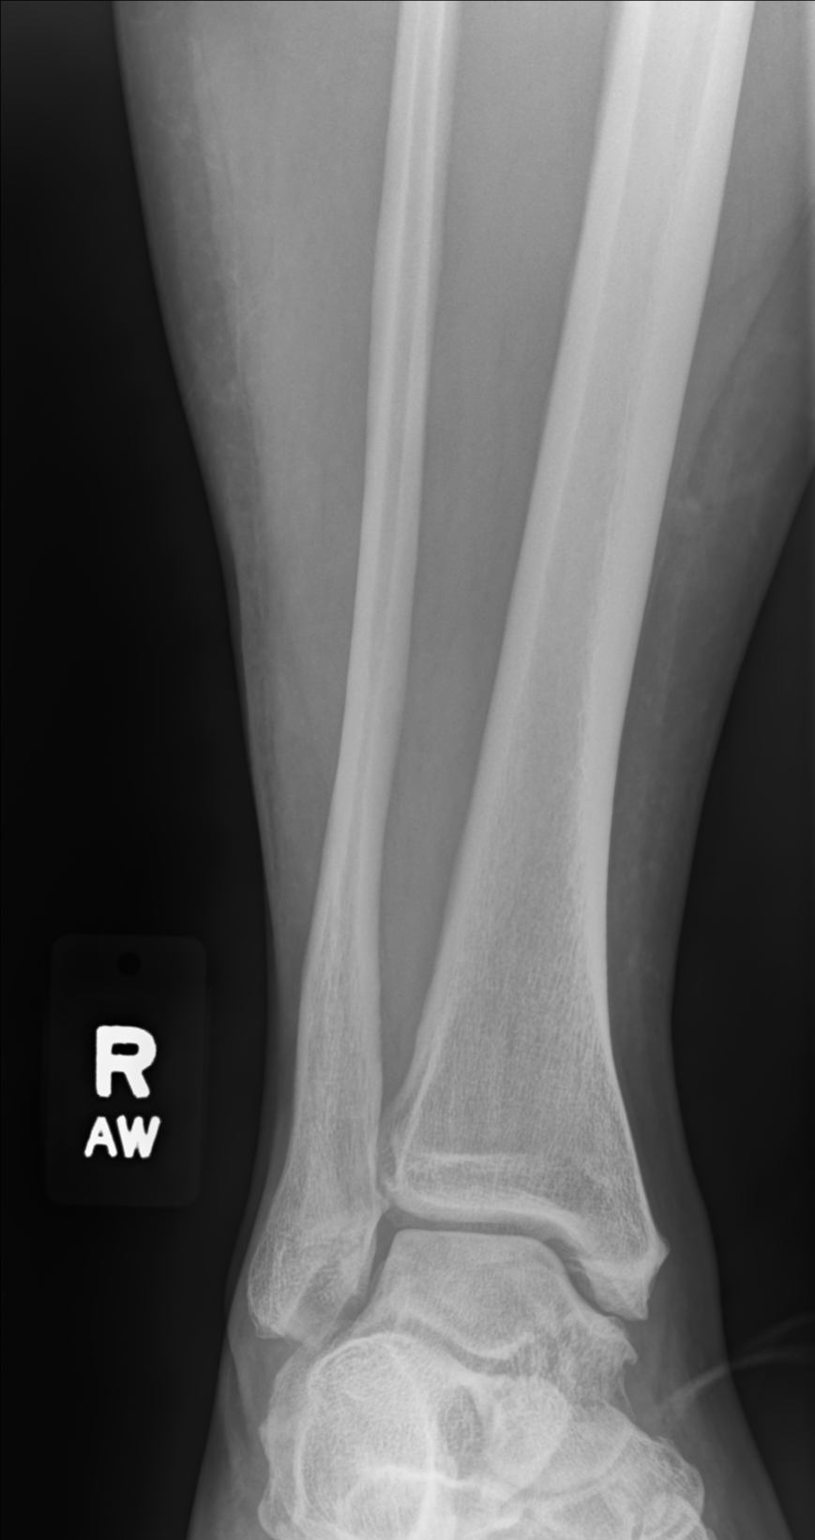

[ankle lat]
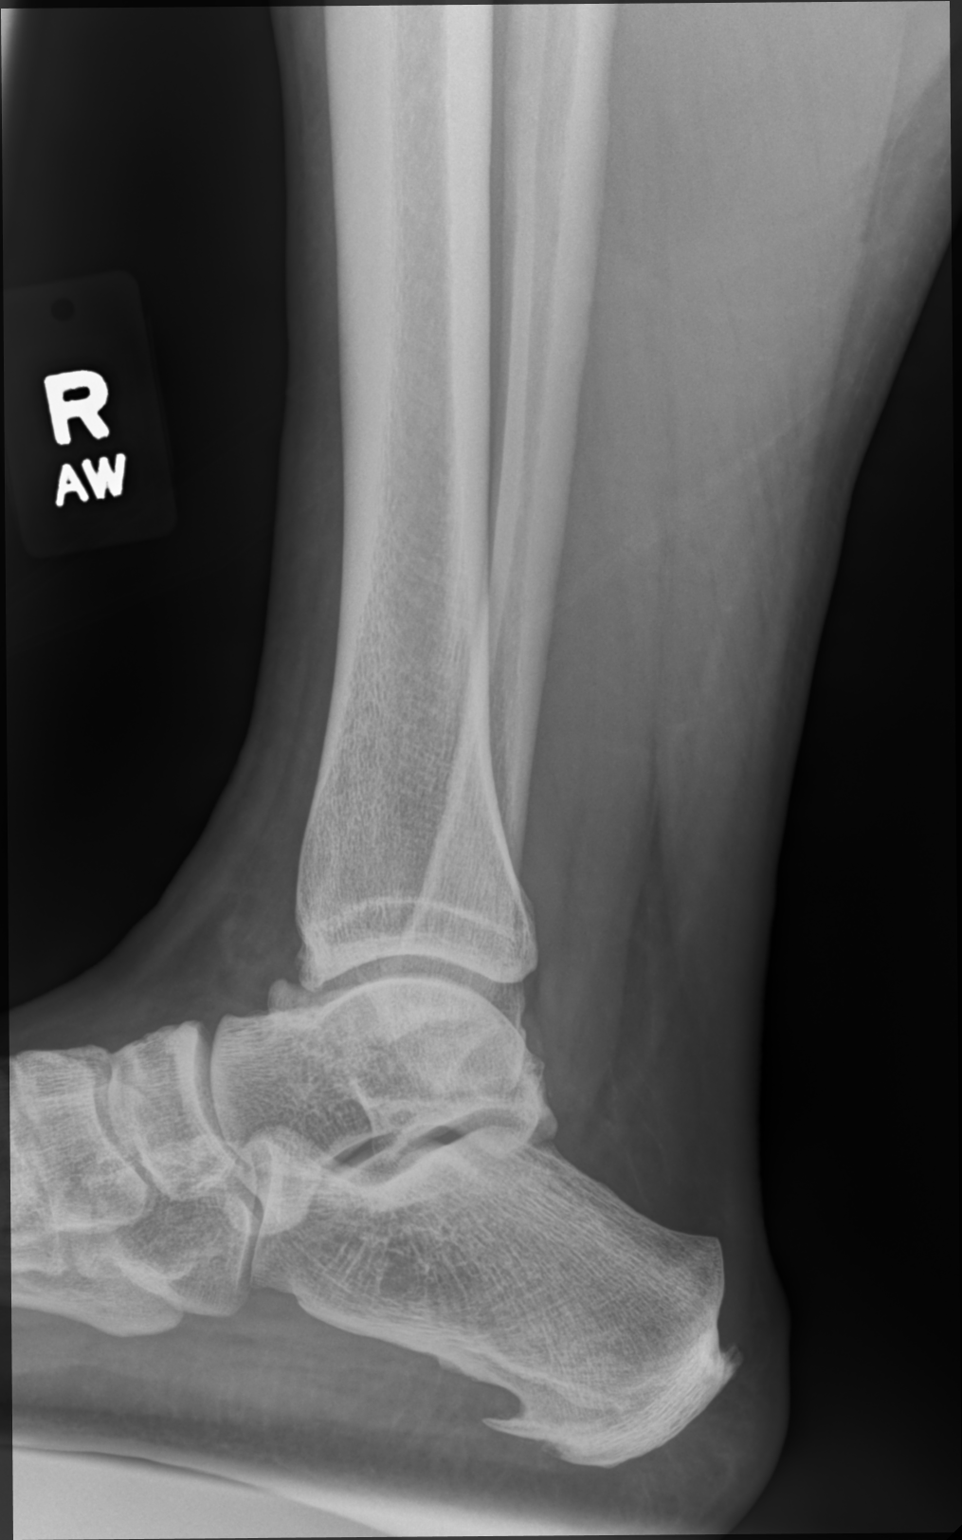

[3 of 3 positions shown; findings below may reference images not displayed]

FINDINGS: Frontal, oblique, and lateral views were obtained. No acute fracture
or joint effusion. Well corticated calcification in the medial
malleolar region may represent residua of prior trauma. There is no
appreciable joint space narrowing or erosion. There are calcaneal
spurs inferiorly and much smaller posteriorly. Ankle mortise appears
intact.
IMPRESSION: Question residua of prior trauma medial malleolus. No acute
appearing fracture. Ankle mortise appears intact. No appreciable
joint space narrowing. There are calcaneal spurs, largest inferior
in location.

## 2020-09-09 NOTE — Patient Instructions (Signed)
Health Maintenance Due  Topic Date Due  . Hepatitis C Screening  Never done  . COVID-19 Vaccine (3 - Moderna risk 4-dose series) 04/21/2020    Depression screen PHQ 2/9 08/13/2020  Decreased Interest 0  Down, Depressed, Hopeless 0  PHQ - 2 Score 0

## 2020-09-10 ENCOUNTER — Ambulatory Visit: Payer: PRIVATE HEALTH INSURANCE | Admitting: Family Medicine

## 2020-09-10 ENCOUNTER — Encounter: Payer: Self-pay | Admitting: Family Medicine

## 2020-09-10 ENCOUNTER — Other Ambulatory Visit: Payer: Self-pay

## 2020-09-10 VITALS — BP 140/100 | HR 81 | Temp 97.9°F | Ht 65.0 in | Wt 252.0 lb

## 2020-09-10 DIAGNOSIS — E010 Iodine-deficiency related diffuse (endemic) goiter: Secondary | ICD-10-CM | POA: Diagnosis not present

## 2020-09-10 DIAGNOSIS — R221 Localized swelling, mass and lump, neck: Secondary | ICD-10-CM

## 2020-09-10 DIAGNOSIS — E041 Nontoxic single thyroid nodule: Secondary | ICD-10-CM | POA: Diagnosis not present

## 2020-09-10 NOTE — Progress Notes (Signed)
Tina Maldonado is a 40 y.o. female  Chief Complaint  Patient presents with  . Follow-up    2 week follow up reassessment for swollen glands on both sides.  Pt not taking anything for it.  Pt will see ENT at the end of this month.    HPI: Tina Maldonado is a 40 y.o. female patient seen previously on 08/13/20 (virtual visit) and 08/27/20 (in-person visit) by Dr. Gena Fray for enlarged salivary gland B/L x few months or more. Labs testing done by Dr. Gena Fray was unremarkable and pt was referred to ENT on 09/03/20 and she has an appt with Dr. Lucia Gaskins on 09/24/20.  Pt clenches teeth and wears a bite block PRN. She notes Lt sided ear pain that is intermittent. No change in size and no constant pain/discomfort.   Past Medical History:  Diagnosis Date  . Cancer ([place])    basal cell carcinoma  . Dysmenorrhea   . Hyperlipidemia   . Thyroid disease     Past Surgical History:  Procedure Laterality Date  . 2 colposcopy    . CERVICAL BIOPSY  W/ LOOP ELECTRODE EXCISION    . COLPOSCOPY    . DILATION AND CURETTAGE OF UTERUS    . WISDOM TOOTH EXTRACTION      Social History   Socioeconomic History  . Marital status: Single    Spouse name: Not on file  . Number of children: Not on file  . Years of education: Not on file  . Highest education level: Not on file  Occupational History  . Not on file  Tobacco Use  . Smoking status: Never Smoker  . Smokeless tobacco: Never Used  Vaping Use  . Vaping Use: Never used  Substance and Sexual Activity  . Alcohol use: Never  . Drug use: Never  . Sexual activity: Yes    Birth control/protection: Surgical    Comment: vasectomy  Other Topics Concern  . Not on file  Social History Narrative  . Not on file   Social Determinants of Health   Financial Resource Strain: Not on file  Food Insecurity: Not on file  Transportation Needs: Not on file  Physical Activity: Not on file  Stress: Not on file  Social Connections: Not on file  Intimate Partner Violence:  Not on file    Family History  Problem Relation Age of Onset  . Miscarriages / [place] Mother   . Thyroid disease Mother   . Thyroid disease Maternal Grandmother   . Arthritis Maternal Grandmother   . Heart disease Paternal Grandmother   . Heart disease Paternal Grandfather      Immunization History  Administered Date(s) Administered  . Influenza Whole 04/06/2020  . Influenza-Unspecified 03/28/2018  . Moderna Sars-Covid-2 Vaccination 02/19/2020, 03/24/2020  . Tdap 06/28/2016    Outpatient Encounter Medications as of 09/10/2020  Medication Sig Note  . betamethasone valerate ointment (VALISONE) 0.1 % Use a small amount BID for up to 2 weeks as needed. Not for daily use. You can use it 2 x a week as maintenance   . cetirizine (ZYRTEC) 10 MG tablet Take 10 mg by mouth as needed for allergies. 08/27/2020: prn  . ergocalciferol (VITAMIN D2) 1.25 MG (50000 UT) capsule Take 1 capsule (50,000 Units total) by mouth once a week.   . Multiple Vitamin (MULTIVITAMIN PO) Take by mouth.   . OIL OF OREGANO PO Take by mouth daily.    No facility-administered encounter medications on file as of 09/10/2020.  ROS: Pertinent positives and negatives noted in HPI. Remainder of ROS non-contributory    Allergies  Allergen Reactions  . Yeast Hives  . Tetanus Toxoids     Hives, swelling  . Pork Allergy Other (See Comments)    BP (!) 140/100 (BP Location: Left Arm, Patient Position: Sitting, Cuff Size: Normal)   Pulse 81   Temp 97.9 F (36.6 C) (Temporal)   Ht 5\' 5"  (1.651 m)   Wt 252 lb (114.3 kg)   LMP 09/04/2020   SpO2 99%   BMI 41.93 kg/m   Wt Readings from Last 3 Encounters:  09/10/20 252 lb (114.3 kg)  08/27/20 252 lb 3.2 oz (114.4 kg)  11/08/19 252 lb 9.6 oz (114.6 kg)   Temp Readings from Last 3 Encounters:  09/10/20 97.9 F (36.6 C) (Temporal)  08/27/20 (!) 97.5 F (36.4 C) (Temporal)  11/08/19 98.4 F (36.9 C) (Temporal)   BP Readings from Last 3 Encounters:   09/10/20 (!) 140/100  08/27/20 138/82  11/08/19 (!) 140/100   Pulse Readings from Last 3 Encounters:  09/10/20 81  08/27/20 (!) 105  11/08/19 (!) 116     Physical Exam HENT:     Head:     Jaw: Tenderness present.     Salivary Glands: Right salivary gland is not diffusely enlarged or tender. Left salivary gland is not diffusely enlarged or tender.     Mouth/Throat:     Lips: Pink.     Mouth: Mucous membranes are moist.     Pharynx: Oropharynx is clear. Uvula midline.     Tonsils: No tonsillar exudate.  Neck:     Thyroid: No thyroid tenderness.  Musculoskeletal:     Cervical back: Normal range of motion.  Lymphadenopathy:     Cervical: No cervical adenopathy.  Neurological:     Mental Status: She is alert.  Psychiatric:        Mood and Affect: Mood is anxious. Affect is tearful.        Behavior: Behavior normal.      A/P:  1. Thyroid nodule 2. Thyromegaly - last [place] in 09/2018 and pt was to f/u w/ annual [place] - US THYROID; Future  3. Neck swelling - likely subcutaneous fat/tissue but ? LN or possible cyst on Lt neck. Pt is very anxious about this - US Soft Tissue Head/Neck (NON-THYROID); Future - has ENT appt on 09/24/20   This visit occurred during the SARS-CoV-2 public health emergency.  Safety protocols were in place, including screening questions prior to the visit, additional usage of staff PPE, and extensive cleaning of exam room while observing appropriate contact time as indicated for disinfecting solutions.

## 2020-09-24 ENCOUNTER — Ambulatory Visit (INDEPENDENT_AMBULATORY_CARE_PROVIDER_SITE_OTHER): Payer: PRIVATE HEALTH INSURANCE | Admitting: Otolaryngology

## 2020-09-25 ENCOUNTER — Other Ambulatory Visit: Payer: Self-pay

## 2020-09-25 ENCOUNTER — Ambulatory Visit (INDEPENDENT_AMBULATORY_CARE_PROVIDER_SITE_OTHER): Payer: PRIVATE HEALTH INSURANCE | Admitting: Otolaryngology

## 2020-09-25 VITALS — Temp 97.7°F

## 2020-09-25 DIAGNOSIS — R590 Localized enlarged lymph nodes: Secondary | ICD-10-CM | POA: Diagnosis not present

## 2020-09-25 DIAGNOSIS — J310 Chronic rhinitis: Secondary | ICD-10-CM

## 2020-09-25 DIAGNOSIS — H73892 Other specified disorders of tympanic membrane, left ear: Secondary | ICD-10-CM | POA: Diagnosis not present

## 2020-09-25 DIAGNOSIS — H6983 Other specified disorders of Eustachian tube, bilateral: Secondary | ICD-10-CM

## 2020-09-25 NOTE — Progress Notes (Signed)
HPI: Tina Maldonado is a 40 y.o. female who presents is referred by Dr. Gena Fray for evaluation of "knots" that she feels beneath the jaw on both sides with her fingers.  She feels the knots when she palpates on either side of the sternocleidomastoid muscle just below the angle of the jaw. She has also had chronic nasal sinus issues with a lot of congestion and drainage from her nose.  She uses Nettie pot frequently to help with this as well as allergy medication.  She has used Flonase in the past but does not like to use this regularly because it tastes bad. She states that she has had recent bouts of mono. She has also had chronic ear problems ever since she was a child with history of recurrent ear infections as well as ruptured eardrums.  She states that she is able to "pop" her ears..  Past Medical History:  Diagnosis Date  . Cancer ([place])    basal cell carcinoma  . Dysmenorrhea   . Hyperlipidemia   . Thyroid disease    Past Surgical History:  Procedure Laterality Date  . 2 colposcopy    . CERVICAL BIOPSY  W/ LOOP ELECTRODE EXCISION    . COLPOSCOPY    . DILATION AND CURETTAGE OF UTERUS    . WISDOM TOOTH EXTRACTION     Social History   Socioeconomic History  . Marital status: Single    Spouse name: Not on file  . Number of children: Not on file  . Years of education: Not on file  . Highest education level: Not on file  Occupational History  . Not on file  Tobacco Use  . Smoking status: Never Smoker  . Smokeless tobacco: Never Used  Vaping Use  . Vaping Use: Never used  Substance and Sexual Activity  . Alcohol use: Never  . Drug use: Never  . Sexual activity: Yes    Birth control/protection: Surgical    Comment: vasectomy  Other Topics Concern  . Not on file  Social History Narrative  . Not on file   Social Determinants of Health   Financial Resource Strain: Not on file  Food Insecurity: Not on file  Transportation Needs: Not on file  Physical Activity: Not on file   Stress: Not on file  Social Connections: Not on file   Family History  Problem Relation Age of Onset  . Miscarriages / [place] Mother   . Thyroid disease Mother   . Thyroid disease Maternal Grandmother   . Arthritis Maternal Grandmother   . Heart disease Paternal Grandmother   . Heart disease Paternal Grandfather    Allergies  Allergen Reactions  . Yeast Hives  . Tetanus Toxoids     Hives, swelling  . Pork Allergy Other (See Comments)   Prior to Admission medications   Medication Sig Start Date End Date Taking? Authorizing Provider  betamethasone valerate ointment (VALISONE) 0.1 % Use a small amount BID for up to 2 weeks as needed. Not for daily use. You can use it 2 x a week as maintenance 11/08/19   Salvadore Dom, MD  cetirizine (ZYRTEC) 10 MG tablet Take 10 mg by mouth as needed for allergies.    [provider]  ergocalciferol (VITAMIN D2) 1.25 MG (50000 UT) capsule Take 1 capsule (50,000 Units total) by mouth once a week. 11/06/18   Shamleffer, Melanie Crazier, MD  Multiple Vitamin (MULTIVITAMIN PO) Take by mouth.    [provider]  OIL OF OREGANO PO  Take by mouth daily.    [provider]     Positive ROS: Otherwise negative  All other systems have been reviewed and were otherwise negative with the exception of those mentioned in the HPI and as above.  Physical Exam: Constitutional: Alert, well-appearing, no acute distress.  Patient has had a recent shoulder injury and is in mild discomfort. Ears: External ears without lesions or tenderness. Ear canals are clear bilaterally.  On microscopic exam of the ears the right TM is relatively clear although she has some chronic TM changes on the right side.  The left TM is clear but has a large anterior superior retraction pocket that the patient is able to insufflate herself and release most of the pocket from the middle ear attachment.  There is no evidence of cholesteatoma or active  infection. Nasal: External nose without lesions. Septum with minimal deformity and moderate rhinitis.  After decongesting the nose there are no polyps noted intranasally.  The middle meatus regions were clear with no active mucopurulent discharge..  The mucus within the nose is clear. Oral: Lips and gums without lesions. Tongue and palate mucosa without lesions. Posterior oropharynx clear.  Tonsils are mild to moderate size 2+.  No acute exudate.  Tonsils appear symmetric.  Indirect laryngoscopy revealed a clear base of tongue. Neck: No palpable adenopathy or masses.  Palpation patient has normal palpation of the submandibular glands bilaterally.  She has generous size upper jugular lymph nodes deep to the sternocleidomastoid muscle measuring approximately 1 and half  cm in size.  These are mobile and feel to be consistent with a reactive lymphadenopathy.  She has no supraclavicular lymphadenopathy and no significant lymphadenopathy along the posterior accessory chain of lymph nodes. Respiratory: Breathing comfortably  Skin: No facial/neck lesions or rash noted.  Procedures  Assessment: Chronic rhinitis Chronic bilateral eustachian tube dysfunction worse on the left side. Reactive lymphadenopathy.  Plan: Discussed with patient concerning the lymph nodes that she is palpating are consistent with a reactive lymphadenopathy and are not consistent with neoplasm or malignancy. Discussed with her concerning chronic TM changes and would recommend "popping" her ears on a daily basis to help resolve the chronic pocket and adherence to the middle ear space. Also encouraged her to use of nasal steroid spray regularly on a nighttime basis and suggested trying Nasacort 2 sprays each nostril at night and gave her a prescription for this.  She should continue with the saline irrigations as this is beneficial.   Radene Journey, MD   CC:

## 2020-11-03 NOTE — Progress Notes (Signed)
40 y.o. G1P1001 Single Other or two or more races Not Hispanic or Latino female here for annual exam.  Sexually active, same partner on and off for years (he could have other partners). They aren't dating. They don't use condoms. He has had a vasectomy.  Menses q month x 4-5 days. She can saturate a super tampon in 2-3 hours. No BTB. Mild dysmenorrhea.  She has been having intermittent spotting when she voids, sure it isn't from her vagina. She see's it when she wipes only. Urinalysis in 3/22 0-2 RBC/hpf. She is 100% sure it is coming from her urethra.   She c/o a several month h/o random, intermittent pain in either the LUQ or LLQ. The pains don't come at the same time, doesn't think the pains are related. She has regular BM's. The pain isn't cyclic, happens a lot.      She has had 2 leeps, last in ~2018, pap in 2019 was negative. Last 2 paps normal but + for HR HPV. Colposcopy from 11/08/19 was unsatisfactory. 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   She had a negative anal pap with negative HPV last year.   Patient's last menstrual period was 11/05/2020 (exact date).          Sexually active: Yes.    The current method of family planning is vasectomy.    Exercising: No.  The patient does not participate in regular exercise at present. Smoker:  no  Health Maintenance: Pap: 11/08/19 WNL HR HPV Neg, 2/10//21 Neg + HR HPV  History of abnormal Pap:  yes MMG:  07/14/18 Normal  BMD:   None  Colonoscopy: 2019 normal  TDaP:  2018 Gardasil: complete    reports that she has never smoked. She has never used smokeless tobacco. She reports that she does not drink alcohol and does not use drugs. She is a MRI tech.  She just bought a house and is fixing it up. Daughter will be 17 in June.  Past Medical History:  Diagnosis Date  . Cancer ([place])    basal cell carcinoma  . Dysmenorrhea   . Hyperlipidemia   . Thyroid disease     Past Surgical History:  Procedure Laterality  Date  . 2 colposcopy    . CERVICAL BIOPSY  W/ LOOP ELECTRODE EXCISION    . COLPOSCOPY    . DILATION AND CURETTAGE OF UTERUS    . WISDOM TOOTH EXTRACTION      Current Outpatient Medications  Medication Sig Dispense Refill  . betamethasone valerate ointment (VALISONE) 0.1 % Use a small amount BID for up to 2 weeks as needed. Not for daily use. You can use it 2 x a week as maintenance 30 g 1  . cetirizine (ZYRTEC) 10 MG tablet Take 10 mg by mouth as needed for allergies.    Marland Kitchen ergocalciferol (VITAMIN D2) 1.25 MG (50000 UT) capsule Take 1 capsule (50,000 Units total) by mouth once a week. 12 capsule 0  . Multiple Vitamin (MULTIVITAMIN PO) Take by mouth.    . OIL OF OREGANO PO Take by mouth daily.     No current facility-administered medications for this visit.    Family History  Problem Relation Age of Onset  . Miscarriages / [place] Mother   . Thyroid disease Mother   . Thyroid disease Maternal Grandmother   . Arthritis Maternal Grandmother   . Heart disease Paternal Grandmother   . Heart disease Paternal Grandfather     Review  of Systems  Gastrointestinal: Positive for abdominal pain.  All other systems reviewed and are negative.   Exam:   BP 140/90   Pulse 85   Ht 5\' 5"  (1.651 m)   Wt 253 lb (114.8 kg)   LMP 11/05/2020 (Exact Date)   SpO2 100%   BMI 42.10 kg/m   Weight change: @WEIGHTCHANGE @ Height:   Height: 5\' 5"  (165.1 cm)  Ht Readings from Last 3 Encounters:  11/05/20 5\' 5"  (1.651 m)  09/10/20 5\' 5"  (1.651 m)  08/27/20 5\' 5"  (1.651 m)    General appearance: alert, cooperative and appears stated age Head: Normocephalic, without obvious abnormality, atraumatic Neck: no adenopathy, supple, symmetrical, trachea midline and thyroid normal to inspection and palpation Lungs: clear to auscultation bilaterally Cardiovascular: regular rate and rhythm Breasts: normal appearance, no masses or tenderness Abdomen: soft, non-tender; non distended,  no masses,  no  organomegaly Extremities: extremities normal, atraumatic, no cyanosis or edema Skin: Skin color, texture, turgor normal. No rashes or lesions Lymph nodes: Cervical, supraclavicular, and axillary nodes normal. No abnormal inguinal nodes palpated Neurologic: Grossly normal   Pelvic: External genitalia:  no lesions              Urethra:  normal appearing urethra with no masses, tenderness or lesions              Bartholins and Skenes: normal                 Vagina: normal appearing vagina with normal color and discharge, no lesions              Cervix: no lesions               Bimanual Exam:  Uterus:  normal size, contour, position, consistency, mobility, non-tender and anteverted              Adnexa: no mass, fullness, tenderness               Rectovaginal: Confirms               Anus:  normal sphincter tone, no lesions  Caryn Bee chaperoned for the exam.  1. Well woman exam Discussed breast self exam Discussed calcium and vit D intake Mammogram due, she will schedule  2. Screening examination for STD (sexually transmitted disease) - HIV Antibody (routine testing w rflx) - RPR - Cytology - PAP  3. Cervical dysplasia Pap with hpv done  4. History of gross hematuria Intermittent  - Ambulatory referral to Urology  5. Elevated lipids Patient was advised she needs to f/u with her primary. Desired blood work today. - Lipid panel  6. Vitamin D deficiency - VITAMIN D 25 Hydroxy (Vit-D Deficiency, Fractures)  7. Screening for cervical cancer - Cytology - PAP  8. Elevated BP without diagnosis of hypertension Repeat BP 150/100 Needs to f/u with her primary  9. Abdominal pain, unspecified abdominal location She has LUQ abdominal pain and LLQ abdominal pain. Exam is benign. She will discuss with her primary. Discussed the option of GYN ultrasound for the LLQ pain if her primary doesn't find an explanation for it.

## 2020-11-05 ENCOUNTER — Other Ambulatory Visit: Payer: Self-pay

## 2020-11-05 ENCOUNTER — Encounter: Payer: Self-pay | Admitting: Obstetrics and Gynecology

## 2020-11-05 ENCOUNTER — Ambulatory Visit (INDEPENDENT_AMBULATORY_CARE_PROVIDER_SITE_OTHER): Payer: PRIVATE HEALTH INSURANCE | Admitting: Obstetrics and Gynecology

## 2020-11-05 ENCOUNTER — Other Ambulatory Visit (HOSPITAL_COMMUNITY)
Admission: RE | Admit: 2020-11-05 | Discharge: 2020-11-05 | Disposition: A | Discharge status: Home / Self Care | Payer: PRIVATE HEALTH INSURANCE | Source: Ambulatory Visit | Attending: Obstetrics and Gynecology | Admitting: Obstetrics and Gynecology

## 2020-11-05 VITALS — BP 140/90 | HR 85 | Ht 65.0 in | Wt 253.0 lb

## 2020-11-05 DIAGNOSIS — Z124 Encounter for screening for malignant neoplasm of cervix: Secondary | ICD-10-CM

## 2020-11-05 DIAGNOSIS — Z01419 Encounter for gynecological examination (general) (routine) without abnormal findings: Secondary | ICD-10-CM

## 2020-11-05 DIAGNOSIS — Z87898 Personal history of other specified conditions: Secondary | ICD-10-CM

## 2020-11-05 DIAGNOSIS — N879 Dysplasia of cervix uteri, unspecified: Secondary | ICD-10-CM

## 2020-11-05 DIAGNOSIS — R030 Elevated blood-pressure reading, without diagnosis of hypertension: Secondary | ICD-10-CM | POA: Diagnosis not present

## 2020-11-05 DIAGNOSIS — R109 Unspecified abdominal pain: Secondary | ICD-10-CM

## 2020-11-05 DIAGNOSIS — Z113 Encounter for screening for infections with a predominantly sexual mode of transmission: Secondary | ICD-10-CM | POA: Diagnosis present

## 2020-11-05 DIAGNOSIS — Z87448 Personal history of other diseases of urinary system: Secondary | ICD-10-CM | POA: Diagnosis not present

## 2020-11-05 DIAGNOSIS — E785 Hyperlipidemia, unspecified: Secondary | ICD-10-CM | POA: Diagnosis not present

## 2020-11-05 DIAGNOSIS — E559 Vitamin D deficiency, unspecified: Secondary | ICD-10-CM

## 2020-11-05 NOTE — Patient Instructions (Signed)

## 2020-11-06 ENCOUNTER — Telehealth: Payer: Self-pay | Admitting: *Deleted

## 2020-11-06 LAB — HIV ANTIBODY (ROUTINE TESTING W REFLEX): HIV 1&2 Ab, 4th Generation: NONREACTIVE

## 2020-11-06 LAB — LIPID PANEL
Cholesterol: 241 mg/dL — ABNORMAL HIGH (ref <200)
HDL: 58 mg/dL (ref >=50)
LDL Cholesterol (Calc): 151 mg/dL (calc) — ABNORMAL HIGH
Non-HDL Cholesterol (Calc): 183 mg/dL (calc) — ABNORMAL HIGH (ref <130)
Total CHOL/HDL Ratio: 4.2 (calc) (ref <5.0)
Triglycerides: 185 mg/dL — ABNORMAL HIGH (ref <150)

## 2020-11-06 LAB — VITAMIN D 25 HYDROXY (VIT D DEFICIENCY, FRACTURES): Vit D, 25-Hydroxy: 27 ng/mL — ABNORMAL LOW (ref 30–100)

## 2020-11-06 LAB — RPR: RPR Ser Ql: NONREACTIVE

## 2020-11-06 NOTE — Telephone Encounter (Signed)
Office notes faxed to [facility] for [place], they will call patient to schedule.

## 2020-11-06 NOTE — Telephone Encounter (Signed)
-----   Message from Salvadore Dom, MD sent at 11/05/2020 12:20 PM EDT ----- Claris Gladden, I've placed a referral for this patient to see Dr Claudia Desanctis at [facility]. Thanks, Sharee Pimple

## 2020-11-10 LAB — CYTOLOGY - PAP
Chlamydia: NEGATIVE
Comment: NEGATIVE
Comment: NEGATIVE
Comment: NEGATIVE
Comment: NORMAL
Diagnosis: UNDETERMINED — AB
High risk HPV: NEGATIVE
Neisseria Gonorrhea: NEGATIVE
Trichomonas: NEGATIVE

## 2020-11-11 NOTE — Telephone Encounter (Signed)
Scheduled on 12/18/20 @ 1pm with Dr.Pace.

## 2021-03-10 IMAGING — US US THYROID
1 series · 13 of 25 positions shown · non-contrast
Comparison: None.

CLINICAL DATA: 38-year-old female with a history of thyroid cyst

EXAM:
THYROID ULTRASOUND
TECHNIQUE: Ultrasound examination of the thyroid gland and adjacent soft
tissues was performed.

[Series 1: us thyroid · 0.06mm/px · 62 acquisitions, 13 frames shown]
[im 1/62]
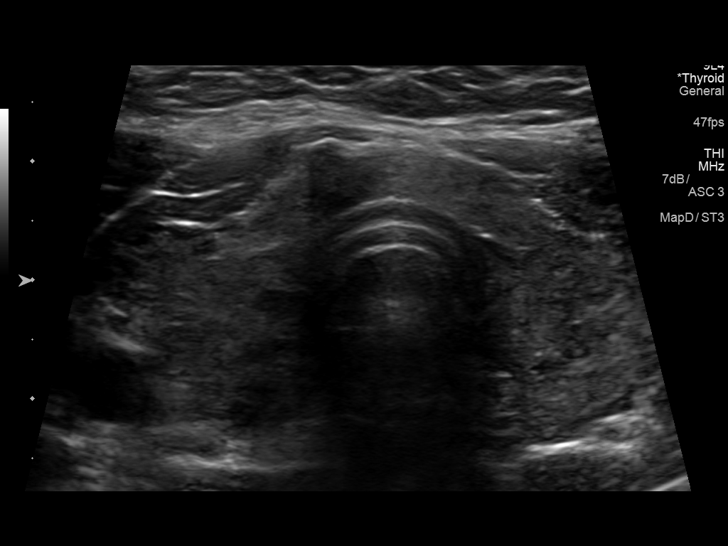
[im 6/62]
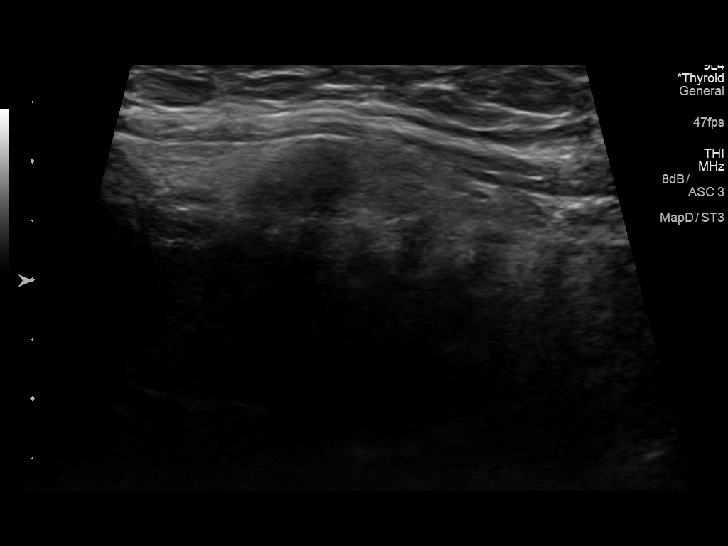
[im 11/62]
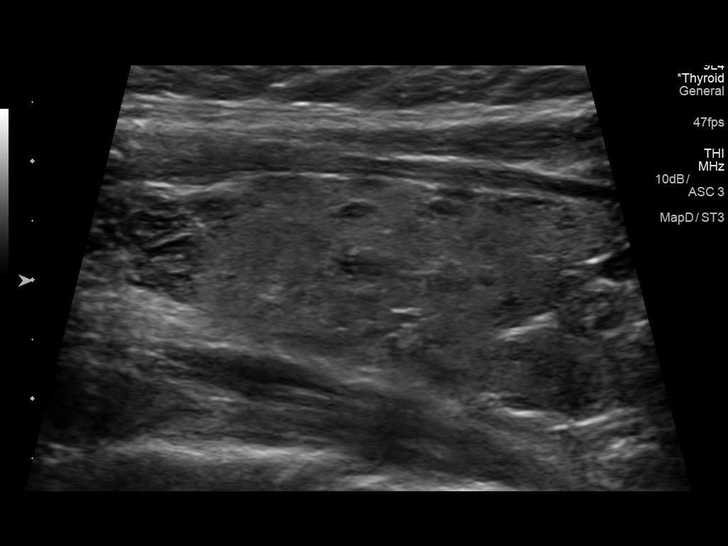
[im 16/62]
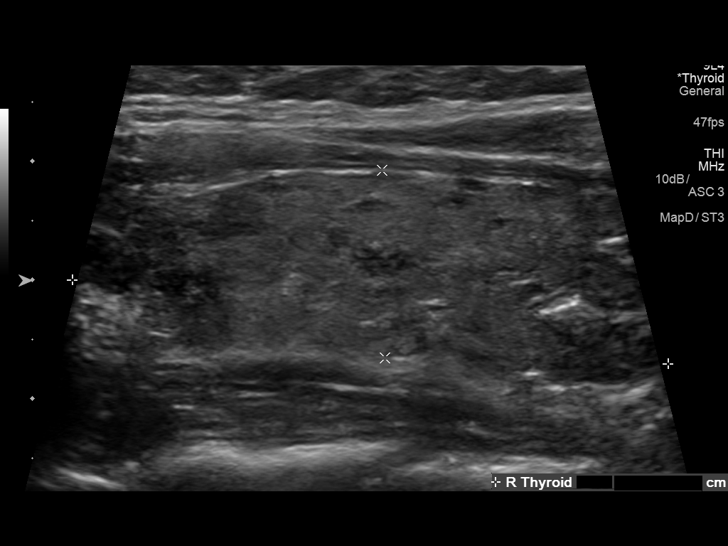
[im 21/62]
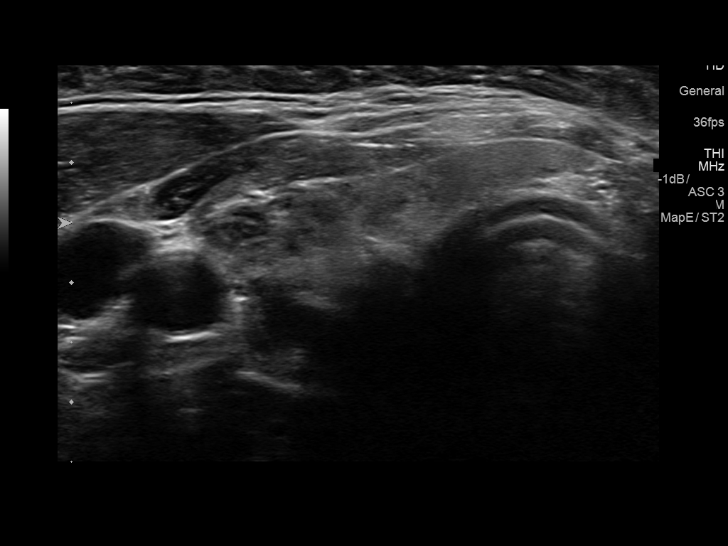
[im 26/62]
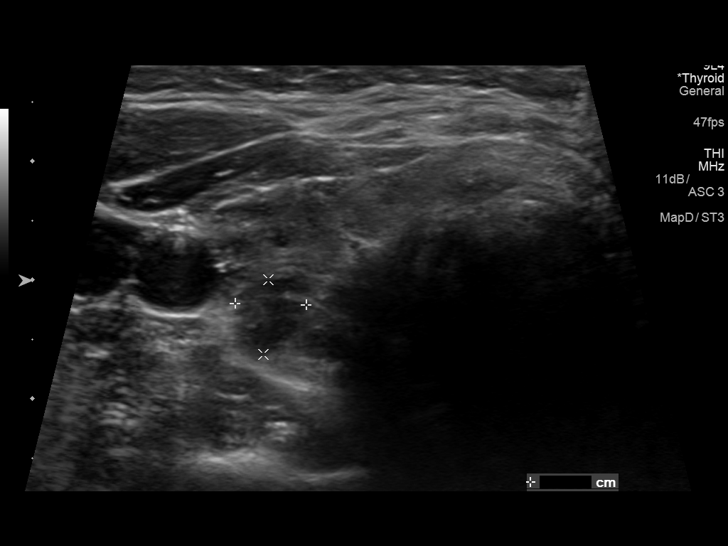
[im 31/62]
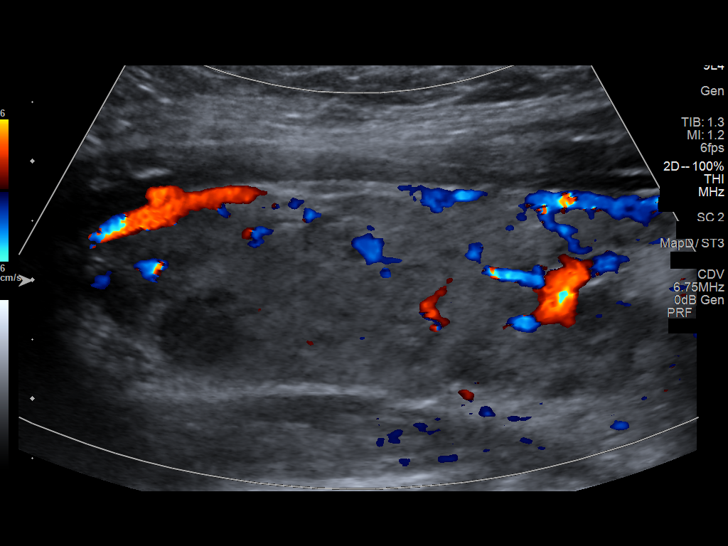
[im 36/62]
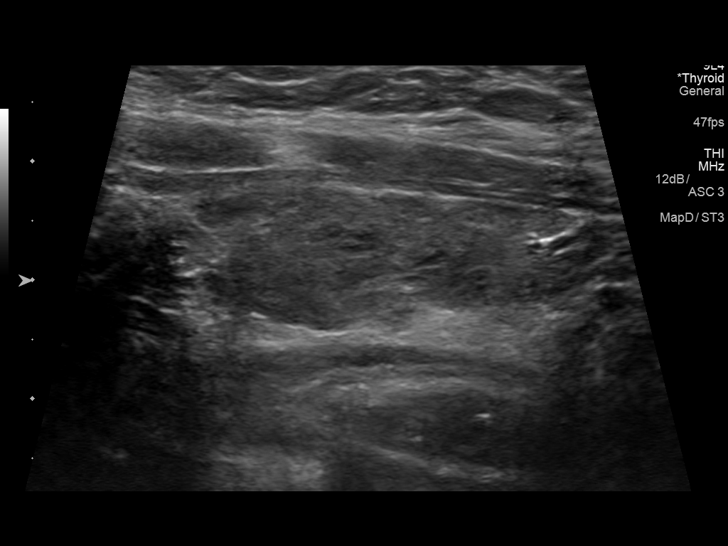
[im 41/62]
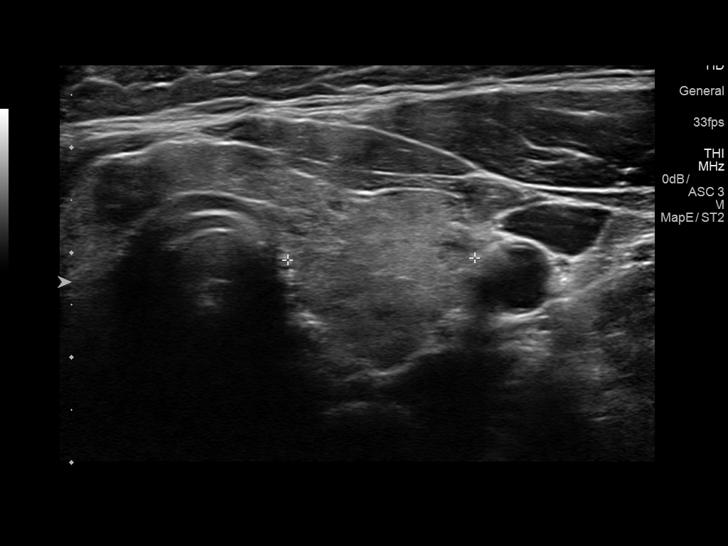
[im 46/62]
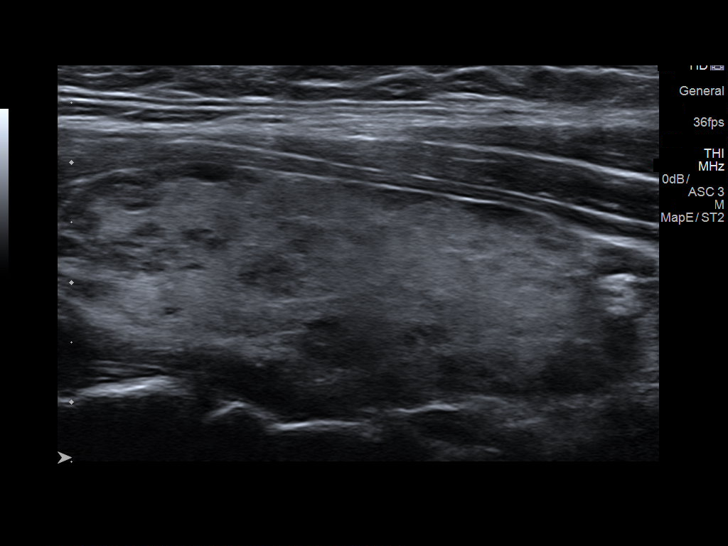
[im 51/62]
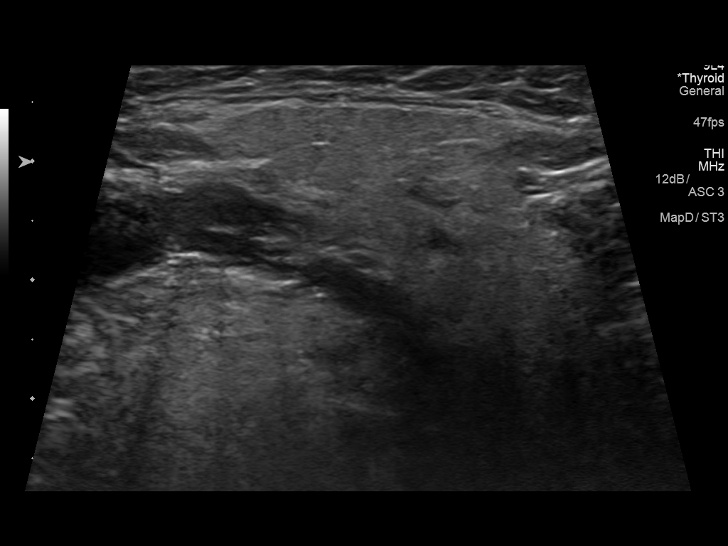
[im 56/62]
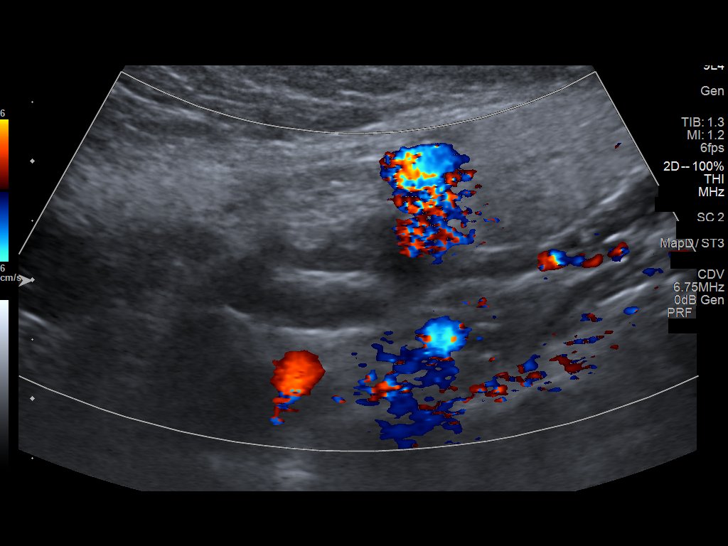
[im 62/62]
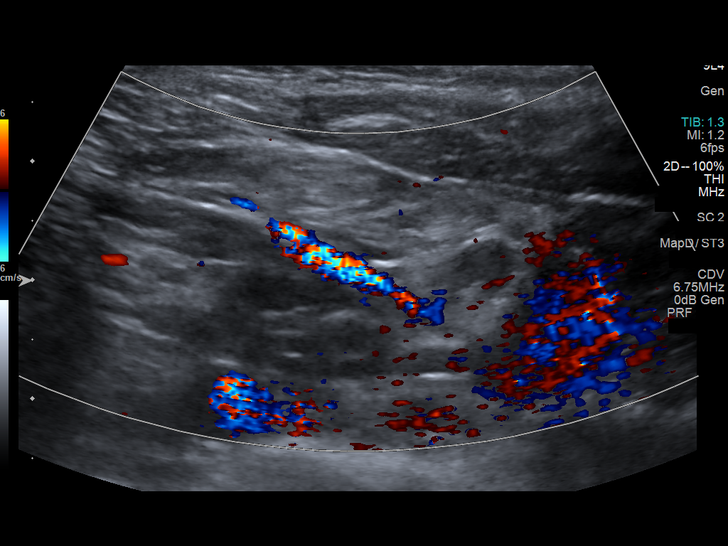

[13 of 25 positions shown; findings below may reference images not displayed]

FINDINGS: Parenchymal Echotexture: Moderately heterogenous

Isthmus: 0.4 cm

Right lobe: 5.2 cm x 1.9 cm x 1.8 cm

Left lobe: 5.6 cm x 1.7 cm x 1.8 cm

_________________________________________________________

Estimated total number of nodules >/= 1 cm: 0

Number of spongiform nodules >/=  2 cm not described below (TR1): 0

Number of mixed cystic and solid nodules >/= 1.5 cm not described
below (TR2): 0

_________________________________________________________

Nodule # 1:

Location: Isthmus; Mid

Maximum size: 0.9 cm; Other 2 dimensions: 0.7 cm x 0.6 cm

Composition: cannot determine (2)

Echogenicity: very hypoechoic (3)

Shape: not taller-than-wide (0)

Margins: ill-defined (0)

Echogenic foci: none (0)

ACR TI-RADS total points: 5.

ACR TI-RADS risk category: TR4 (4-6 points).

ACR TI-RADS recommendations:

Nodule does not meet criteria for surveillance or biopsy

_________________________________________________________

Nodule # 2:

Location: Right; Superior

Maximum size: 0.8 cm; Other 2 dimensions: 0.6 cm x 0.6 cm

Composition: cannot determine (2)

Echogenicity: hypoechoic (2)

Shape: taller-than-wide (3)

Margins: ill-defined (0)

Echogenic foci: none (0)

ACR TI-RADS total points: 7.

ACR TI-RADS risk category: TR5 (>/= 7 points).

ACR TI-RADS recommendations:

Nodule meets criteria for surveillance

_________________________________________________________

No adenopathy
IMPRESSION: Right mid thyroid nodule meets criteria for surveillance, as
designated by the newly established ACR TI-RADS criteria.
Surveillance ultrasound study recommended to be performed annually
up to 5 years.

Recommendations follow those established by the new ACR TI-RADS
criteria ([HOSPITAL] 9742;[DATE]).

## 2021-09-16 ENCOUNTER — Encounter: Payer: PRIVATE HEALTH INSURANCE | Admitting: Nurse Practitioner

## 2021-09-16 ENCOUNTER — Telehealth: Payer: Self-pay | Admitting: Nurse Practitioner

## 2021-09-16 NOTE — Telephone Encounter (Signed)
Pt did not show for her TOC app with Baldo Ash on 09/16/21. I sent her a no show letter. ?

## 2021-09-24 NOTE — Telephone Encounter (Signed)
1st no show, letter sent, fee waived KO ?

## 2021-11-04 NOTE — Progress Notes (Signed)
41 y.o. G1P1001 Single Other or two or more races Not Hispanic or Latino female here for annual exam.   ?Menses q month x 4-5 days. Saturates a super tampon is up to 4 hours. ?No BTB, mild cramps. ? ?Sexually active, same partner on and off. He has other partners. Not using condoms. He has had a vasectomy. ? 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  ? ?She has focal vulvar itching (above the clitoris), some perianal itching as well. She feels there is some relation between her diet and itching. No discharge.  ?  ?She has had 2 leeps, last in ~2018, pap in 2019 was negative. In 2020 and 07/2019 paps normal but + for HR HPV. Pap in 5/21 WNL, neg hpv, 5/22 ASCUS, neg hpv ?Colposcopy from 11/08/19 was unsatisfactory. 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  ?  ?She had a negative anal pap with negative HPV last year.  ? ?Patient's last menstrual period was 10/28/2021.          ?Sexually active: Yes.    ?The current method of family planning is none.    ?Exercising: Yes.     Strength training and swimming  ?Smoker:  no ? ?Health Maintenance: ?Pap:  11/05/20 ASCUS Hr HPV Neg, 11/08/19 WNL HR HPV Neg, 2/10//21 Neg + HR HPV  ?History of abnormal Pap:  yes She has had 2 leeps, last in ~2018 ?MMG: 07/14/18 Normal    ?BMD:   none  ?Colonoscopy: 2019 normal  ?TDaP:  2018  ?Gardasil: complete  ? ? reports that she has never smoked. She has never used smokeless tobacco. She reports that she does not drink alcohol and does not use drugs. 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 Travels locally. Daughter will be 73 in June. ? ?Past Medical History:  ?Diagnosis Date  ? Cancer [facility])   ? basal cell carcinoma  ? Dysmenorrhea   ? Hyperlipidemia   ? Thyroid disease   ? ? ?Past Surgical History:  ?Procedure Laterality Date  ? 2 colposcopy    ? CERVICAL BIOPSY  W/ LOOP ELECTRODE EXCISION    ? COLPOSCOPY    ? DILATION AND CURETTAGE OF UTERUS    ? WISDOM TOOTH EXTRACTION    ? ? ?Current  Outpatient Medications  ?Medication Sig Dispense Refill  ? cetirizine (ZYRTEC) 10 MG tablet Take 10 mg by mouth as needed for allergies.    ? ergocalciferol (VITAMIN D2) 1.25 MG (50000 UT) capsule Take 1 capsule (50,000 Units total) by mouth once a week. 12 capsule 0  ? Multiple Vitamin (MULTIVITAMIN PO) Take by mouth.    ? OIL OF OREGANO PO Take by mouth daily.    ? betamethasone valerate ointment (VALISONE) 0.1 % Use a small amount BID for up to 2 weeks as needed. Not for daily use. You can use it 2 x a week as maintenance 30 g 1  ? ?No current facility-administered medications for this visit.  ? ? ?Family History  ?Problem Relation Age of Onset  ? Miscarriages / [place] Mother   ? Thyroid disease Mother   ? Thyroid disease Maternal Grandmother   ? Arthritis Maternal Grandmother   ? Heart disease Paternal Grandmother   ? Heart disease Paternal Grandfather   ? ? ?Review of Systems  ?All other systems reviewed and are negative. ? ?Exam:   ?BP 132/78   Pulse 84   Ht '5\' 5"'$  (1.651 m)  Wt 248 lb (112.5 kg)   LMP 10/28/2021   SpO2 100%   BMI 41.27 kg/m?   Weight change: '@WEIGHTCHANGE'$ @ Height:   Height: '5\' 5"'$  (165.1 cm)  ?Ht Readings from Last 3 Encounters:  ?11/11/21 '5\' 5"'$  (1.651 m)  ?11/05/20 '5\' 5"'$  (1.651 m)  ?09/10/20 '5\' 5"'$  (1.651 m)  ? ? ?General appearance: alert, cooperative and appears stated age ?Head: Normocephalic, without obvious abnormality, atraumatic ?Neck: no adenopathy, supple, symmetrical, trachea midline and thyroid normal to inspection and palpation ?Lungs: clear to auscultation bilaterally ?Cardiovascular: regular rate and rhythm ?Breasts: normal appearance, no masses or tenderness ?Abdomen: soft, non-tender; non distended,  no masses,  no organomegaly ?Extremities: extremities normal, atraumatic, no cyanosis or edema ?Skin: Skin color, texture, turgor normal. No rashes or lesions ?Lymph nodes: Cervical, supraclavicular, and axillary nodes normal. ?No abnormal inguinal nodes  palpated ?Neurologic: Grossly normal ? ? ?Pelvic: External genitalia:  no lesions, no erythema, no whitening ?             Urethra:  normal appearing urethra with no masses, tenderness or lesions ?             Bartholins and Skenes: normal    ?             Vagina: normal appearing vagina with normal color and discharge, no lesions ?             Cervix: no lesions ?              ?Bimanual Exam:  Uterus:   no masses or tenderness ?             Adnexa: no mass, fullness, tenderness ?              Rectovaginal: Confirms ?              Anus:  normal sphincter tone, no lesions ? Perianal area: mild diffuse erythema ? ?Gae Dry chaperoned for the exam. ? ?1. Well woman exam ?Discussed breast self exam ?Discussed calcium and vit D intake ?Mammogram due, she will schedule ? ?2. Screening for cervical cancer ?- Cytology - PAP ? ?3. Screening examination for STD (sexually transmitted disease) ?- RPR ?- HIV Antibody (routine testing w rflx) ?- Hepatitis C antibody ?- SURESWAB CT/NG/T. vaginalis ? ?4. Subacute vulvitis ?Symptoms are focal, negative exam ?- WET PREP FOR TRICH, YEAST, CLUE: neg ?-Discussed vulvar skin care, information given ?- betamethasone valerate ointment (VALISONE) 0.1 %; Use a small amount BID for up to 2 weeks as needed. Not for daily use. You can use it 2 x a week as maintenance  Dispense: 30 g; Refill: 1 ? ?5. Perianal dermatitis ?Discussed skin care ?- betamethasone valerate ointment (VALISONE) 0.1 %; Use a small amount BID for up to 2 weeks as needed. Not for daily use. You can use it 2 x a week as maintenance  Dispense: 30 g; Refill: 1 ? ?6. Elevated lipids ?She is establishing care with a new primary care ?- Lipid panel ? ?7. Vitamin D deficiency ?- VITAMIN D 25 Hydroxy (Vit-D Deficiency, Fractures) ? ?8. Elevated platelet count ?- CBC ? ?9. BMI 40.0-44.9, adult ([place]) ?- Lipid panel ?- Hemoglobin A1c ?- TSH ? ?10. Laboratory exam ordered as part of routine general medical examination ?- CBC ?-  Comprehensive metabolic panel ?- Lipid panel ? ?

## 2021-11-11 ENCOUNTER — Encounter: Payer: Self-pay | Admitting: Obstetrics and Gynecology

## 2021-11-11 ENCOUNTER — Ambulatory Visit (INDEPENDENT_AMBULATORY_CARE_PROVIDER_SITE_OTHER): Payer: No Typology Code available for payment source | Admitting: Obstetrics and Gynecology

## 2021-11-11 ENCOUNTER — Other Ambulatory Visit (HOSPITAL_COMMUNITY)
Admission: RE | Admit: 2021-11-11 | Discharge: 2021-11-11 | Disposition: A | Discharge status: Home / Self Care | Payer: No Typology Code available for payment source | Source: Ambulatory Visit | Attending: Obstetrics and Gynecology | Admitting: Obstetrics and Gynecology

## 2021-11-11 VITALS — BP 132/78 | HR 84 | Ht 65.0 in | Wt 248.0 lb

## 2021-11-11 DIAGNOSIS — R7989 Other specified abnormal findings of blood chemistry: Secondary | ICD-10-CM

## 2021-11-11 DIAGNOSIS — Z113 Encounter for screening for infections with a predominantly sexual mode of transmission: Secondary | ICD-10-CM | POA: Diagnosis not present

## 2021-11-11 DIAGNOSIS — Z124 Encounter for screening for malignant neoplasm of cervix: Secondary | ICD-10-CM | POA: Insufficient documentation

## 2021-11-11 DIAGNOSIS — L309 Dermatitis, unspecified: Secondary | ICD-10-CM

## 2021-11-11 DIAGNOSIS — E559 Vitamin D deficiency, unspecified: Secondary | ICD-10-CM

## 2021-11-11 DIAGNOSIS — E785 Hyperlipidemia, unspecified: Secondary | ICD-10-CM

## 2021-11-11 DIAGNOSIS — Z0000 Encounter for general adult medical examination without abnormal findings: Secondary | ICD-10-CM

## 2021-11-11 DIAGNOSIS — Z6841 Body Mass Index (BMI) 40.0 and over, adult: Secondary | ICD-10-CM

## 2021-11-11 DIAGNOSIS — Z01419 Encounter for gynecological examination (general) (routine) without abnormal findings: Secondary | ICD-10-CM

## 2021-11-11 DIAGNOSIS — N763 Subacute and chronic vulvitis: Secondary | ICD-10-CM

## 2021-11-11 LAB — WET PREP FOR TRICH, YEAST, CLUE

## 2021-11-11 MED ORDER — BETAMETHASONE VALERATE 0.1 % EX OINT: TOPICAL_OINTMENT | CUTANEOUS | 1 refills | Status: DC

## 2021-11-11 NOTE — Patient Instructions (Signed)

## 2021-11-12 LAB — HEMOGLOBIN A1C
Hgb A1c MFr Bld: 5.6 % of total Hgb (ref <5.7)
Mean Plasma Glucose: 114 mg/dL
eAG (mmol/L): 6.3 mmol/L

## 2021-11-12 LAB — CBC
HCT: 41.6 % (ref 35.0–45.0)
Hemoglobin: 14 g/dL (ref 11.7–15.5)
MCH: 29.6 pg (ref 27.0–33.0)
MCHC: 33.7 g/dL (ref 32.0–36.0)
MCV: 87.9 fL (ref 80.0–100.0)
MPV: 8.8 fL (ref 7.5–12.5)
Platelets: 445 10*3/uL — ABNORMAL HIGH (ref 140–400)
RBC: 4.73 10*6/uL (ref 3.80–5.10)
RDW: 12.9 % (ref 11.0–15.0)
WBC: 9.2 10*3/uL (ref 3.8–10.8)

## 2021-11-12 LAB — HEPATITIS C ANTIBODY
Hepatitis C Ab: NONREACTIVE
SIGNAL TO CUT-OFF: 0.05 (ref <1.00)

## 2021-11-12 LAB — COMPREHENSIVE METABOLIC PANEL
AG Ratio: 1.6 (calc) (ref 1.0–2.5)
ALT: 12 U/L (ref 6–29)
AST: 12 U/L (ref 10–30)
Albumin: 4.1 g/dL (ref 3.6–5.1)
Alkaline phosphatase (APISO): 65 U/L (ref 31–125)
BUN: 11 mg/dL (ref 7–25)
CO2: 24 mmol/L (ref 20–32)
Calcium: 9.3 mg/dL (ref 8.6–10.2)
Chloride: 104 mmol/L (ref 98–110)
Creat: 0.63 mg/dL (ref 0.50–0.99)
Globulin: 2.5 g/dL (calc) (ref 1.9–3.7)
Glucose, Bld: 93 mg/dL (ref 65–99)
Potassium: 4 mmol/L (ref 3.5–5.3)
Sodium: 138 mmol/L (ref 135–146)
Total Bilirubin: 0.3 mg/dL (ref 0.2–1.2)
Total Protein: 6.6 g/dL (ref 6.1–8.1)

## 2021-11-12 LAB — LIPID PANEL
Cholesterol: 213 mg/dL — ABNORMAL HIGH (ref <200)
HDL: 58 mg/dL (ref >=50)
LDL Cholesterol (Calc): 122 mg/dL (calc) — ABNORMAL HIGH
Non-HDL Cholesterol (Calc): 155 mg/dL (calc) — ABNORMAL HIGH (ref <130)
Total CHOL/HDL Ratio: 3.7 (calc) (ref <5.0)
Triglycerides: 209 mg/dL — ABNORMAL HIGH (ref <150)

## 2021-11-12 LAB — HIV ANTIBODY (ROUTINE TESTING W REFLEX): HIV 1&2 Ab, 4th Generation: NONREACTIVE

## 2021-11-12 LAB — VITAMIN D 25 HYDROXY (VIT D DEFICIENCY, FRACTURES): Vit D, 25-Hydroxy: 59 ng/mL (ref 30–100)

## 2021-11-12 LAB — SURESWAB CT/NG/T. VAGINALIS
C. trachomatis RNA, TMA: NOT DETECTED
N. gonorrhoeae RNA, TMA: NOT DETECTED
Trichomonas vaginalis RNA: NOT DETECTED

## 2021-11-12 LAB — RPR: RPR Ser Ql: NONREACTIVE

## 2021-11-12 LAB — TSH: TSH: 2.3 mIU/L

## 2021-11-13 LAB — CYTOLOGY - PAP
Comment: NEGATIVE
Diagnosis: NEGATIVE
Diagnosis: REACTIVE
High risk HPV: NEGATIVE

## 2021-12-31 ENCOUNTER — Encounter: Payer: Self-pay | Admitting: Family Medicine

## 2021-12-31 ENCOUNTER — Ambulatory Visit: Payer: 59 | Admitting: Family Medicine

## 2021-12-31 VITALS — BP 124/78 | HR 75 | Temp 97.3°F | Ht 65.0 in | Wt 246.8 lb

## 2021-12-31 DIAGNOSIS — B353 Tinea pedis: Secondary | ICD-10-CM | POA: Diagnosis not present

## 2021-12-31 DIAGNOSIS — R222 Localized swelling, mass and lump, trunk: Secondary | ICD-10-CM | POA: Diagnosis not present

## 2021-12-31 LAB — HM MAMMOGRAPHY

## 2021-12-31 MED ORDER — TERBINAFINE HCL 250 MG PO TABS: 250.0000 mg | ORAL_TABLET | Freq: Every day | ORAL | 0 refills | Status: DC

## 2021-12-31 NOTE — Progress Notes (Signed)
[facility] [address] [place] [address] Dept: [phone] Dept Fax: [phone]  Office Visit  Subjective:    Patient ID: Tina Maldonado, female    DOB: 1981-04-08, 40 y.o..   MRN: [id number]  Chief Complaint  Patient presents with   Acute Visit    C/o having a lump in her LT arm pit x 1 1/2 month  and a spot on her LT foot x 3 months.     History of Present Illness:  Patient is in today for with two complaints.  Tina Maldonado has noted a small lump near her left arm pit. She first noted this 1 1/2 months ago. It has not changed in size. This is not tender. She has noted a mild bruised appearance. She had thought it might be a lymph node.  Tina Maldonado also notes a 3 month history of a rash on her left foot. She states she walks barefoot a great deal, so felt she may have gotten a  fungal infection from doing this. The area on the foot has peeled off skin a bit. She used both Tinactin spray and clotrimazole cream. The rash has waxed and waned and seems to be coming back. She notes there is some associated itching and this has blistered at times.  Past Medical History: Patient Active Problem List   Diagnosis Date Noted   Mild dysplasia of cervix (CIN I) 08/27/2020   History of Hashimoto thyroiditis 08/13/2020   Mixed conductive and sensorineural hearing loss of both ears 11/21/2019   Perforation of left tympanic membrane 11/21/2019   Vitamin D deficiency 11/06/2018   Class 3 severe obesity due to excess calories without serious comorbidity with body mass index (BMI) of 40.0 to 44.9 in adult [facility]) 11/06/2018   Fatigue 11/06/2018   Arthralgia 11/06/2018   Thyroid nodule 11/06/2018   Past Surgical History:  Procedure Laterality Date   2 colposcopy     CERVICAL BIOPSY  W/ LOOP ELECTRODE EXCISION     COLPOSCOPY     DILATION AND CURETTAGE OF UTERUS     WISDOM TOOTH EXTRACTION     Family History  Problem Relation Age of  Onset   Miscarriages / Stillbirths Mother    Thyroid disease Mother    Thyroid disease Maternal Grandmother    Arthritis Maternal Grandmother    Heart disease Paternal Grandmother    Heart disease Paternal Grandfather    Outpatient Medications Prior to Visit  Medication Sig Dispense Refill   betamethasone valerate ointment (VALISONE) 0.1 % Use a small amount BID for up to 2 weeks as needed. Not for daily use. You can use it 2 x a week as maintenance 30 g 1   cetirizine (ZYRTEC) 10 MG tablet Take 10 mg by mouth as needed for allergies.     cholecalciferol (VITAMIN D3) 25 MCG (1000 UNIT) tablet Take 1,000 Units by mouth daily.     Multiple Vitamin (MULTIVITAMIN PO) Take by mouth.     OIL OF OREGANO PO Take by mouth daily.     ergocalciferol (VITAMIN D2) 1.25 MG (50000 UT) capsule Take 1 capsule (50,000 Units total) by mouth once a week. 12 capsule 0   No facility-administered medications prior to visit.   Allergies  Allergen Reactions   Yeast Hives   Tetanus Toxoids     Hives, swelling   Pork Allergy Other (See Comments)     Objective:   Today's Vitals   12/31/21 6734  BP: 124/78  Pulse: 75  Temp: (!) 97.3 F (36.3 C)  TempSrc: Temporal  SpO2: 99%  Weight: 246 lb 12.8 oz (111.9 kg)  Height: '5\' 5"'$  (1.651 m)   Body mass index is 41.07 kg/m.   General: Well developed, well nourished. No acute distress. Skin: Warm and dry. There is a 2-3 mm firm nodule just under the skin of the left lateral chest wall, near the   axilla. There is some mild bruising over this. It is freely mobile. Foot: There is an area of peeling skin to the sole of the left foot from the 4th and 5th toes towards the lateral   forefoot. There are some yellowish erosions on the sides of the toes. Psych: Alert and oriented. Normal mood and affect.  There are no preventive care reminders to display for this patient.    Assessment & Plan:   1. Nodule of anterior chest wall The exam reveals a very small  intradermal nodule. I reassured Tina Maldonado that this is not the size or normal location for a lymph node. This may be the beginning of a benign cyst. I suspect the bruising is coming form abrasion from her bra strap. I recommend we observe this for now.  2. Tinea pedis, left She has an apparent fungus infection that has failed to respond to topical antifungals. I recommend we treat her with an oral antifungal to try and clear this up.  - terbinafine (LAMISIL) 250 MG tablet; Take 1 tablet (250 mg total) by mouth daily.  Dispense: 14 tablet; Refill: 0  Return for Schedule Transfer of Care appointment.   Haydee Salter, MD

## 2022-01-01 ENCOUNTER — Encounter: Payer: Self-pay | Admitting: Family Medicine

## 2022-02-03 ENCOUNTER — Encounter: Payer: No Typology Code available for payment source | Admitting: Family Medicine

## 2022-02-03 ENCOUNTER — Encounter: Payer: Self-pay | Admitting: Family Medicine

## 2022-02-03 ENCOUNTER — Telehealth: Payer: Self-pay | Admitting: Family Medicine

## 2022-02-03 NOTE — Telephone Encounter (Signed)
8.8.23 no show letter sent 

## 2022-02-04 NOTE — Telephone Encounter (Signed)
2nd no show for TOC (1st with [facility]), Dr. Gena Fray has seen pt once. Fee generated.   Dr. Gena Fray, do you want pt to get a final warning or regular warning since 1st no show with you?

## 2022-05-04 ENCOUNTER — Ambulatory Visit: Payer: 59 | Admitting: Family Medicine

## 2022-05-04 ENCOUNTER — Encounter: Payer: Self-pay | Admitting: Family Medicine

## 2022-05-04 VITALS — BP 136/80 | HR 76 | Temp 97.6°F | Ht 65.0 in | Wt 257.0 lb

## 2022-05-04 DIAGNOSIS — H6062 Unspecified chronic otitis externa, left ear: Secondary | ICD-10-CM | POA: Diagnosis not present

## 2022-05-04 DIAGNOSIS — B353 Tinea pedis: Secondary | ICD-10-CM | POA: Diagnosis not present

## 2022-05-04 DIAGNOSIS — Z6841 Body Mass Index (BMI) 40.0 and over, adult: Secondary | ICD-10-CM

## 2022-05-04 DIAGNOSIS — Z23 Encounter for immunization: Secondary | ICD-10-CM

## 2022-05-04 DIAGNOSIS — R222 Localized swelling, mass and lump, trunk: Secondary | ICD-10-CM | POA: Diagnosis not present

## 2022-05-04 MED ORDER — NEOMYCIN-POLYMYXIN-HC 3.5-10000-1 OT SOLN: 3.0000 [drp] | Freq: Four times a day (QID) | OTIC | 1 refills | Status: DC

## 2022-05-04 NOTE — Progress Notes (Signed)
[facility] [address] [place] [address] Dept: [phone] Dept Fax: [phone]  Chronic Care Office Visit  Subjective:    Patient ID: Tina Maldonado, female    DOB: 19-Apr-1981, 41 y.o..   MRN: [id number]  Chief Complaint  Patient presents with   Follow-up    F/u lump on chest.  Wants flu shot.  LT ear pain.     History of Present Illness:  Patient is in today for reassessment of chronic medical issues.  I had seen Ms. Carrol in July to assess a small lump near her left arm pit. I felt this was likely a benign cyst. she notes the lump remains present though she does nto feel it has changed in size.  At her last visit, we did evaluate a rash on her left foot. She walks barefoot a great deal, so felt she may have gotten a  fungal infection from doing this. The area on the foot has peeled off skin a bit. She used both Tinactin spray and clotrimazole cream. The rash has waxed and waned. I had prescribed terbinafine, but she never picked this up. The rash/peelign remains present.  Ms. Tellefsen notes she has issues with recurrent infection in the left ear. Sometimes, this is more of a swimmer's ear issue, though she has not been swimming lately. She has had prior issues with rupture of her TM and there had been concern at one point for whether she might have a cholesteatoma. Ms. Alvizo has also had some minor hearing loss and wonders if she may need a hearing aid.  Ms. Carboni notes she has ahd a long-standing issue with her weight. She is able to lose weight at times when she commits to dietary restrictions and exercise. However, she notes it does not take long when she stops these efforts to gain the weight back. She wonders about whether medications might help.  Past Medical History: Patient Active Problem List   Diagnosis Date Noted   Mild dysplasia of cervix (CIN I) 08/27/2020   History of Hashimoto thyroiditis 08/13/2020    Mixed conductive and sensorineural hearing loss of both ears 11/21/2019   Perforation of left tympanic membrane 11/21/2019   Vitamin D deficiency 11/06/2018   Class 3 severe obesity due to excess calories without serious comorbidity with body mass index (BMI) of 40.0 to 44.9 in adult [facility]) 11/06/2018   Fatigue 11/06/2018   Arthralgia 11/06/2018   Thyroid nodule 11/06/2018   Past Surgical History:  Procedure Laterality Date   2 colposcopy     CERVICAL BIOPSY  W/ LOOP ELECTRODE EXCISION     COLPOSCOPY     DILATION AND CURETTAGE OF UTERUS     WISDOM TOOTH EXTRACTION     Family History  Problem Relation Age of Onset   Miscarriages / Stillbirths Mother    Thyroid disease Mother    Thyroid disease Maternal Grandmother    Arthritis Maternal Grandmother    Heart disease Paternal Grandmother    Heart disease Paternal Grandfather    Outpatient Medications Prior to Visit  Medication Sig Dispense Refill   betamethasone valerate ointment (VALISONE) 0.1 % Use a small amount BID for up to 2 weeks as needed. Not for daily use. You can use it 2 x a week as maintenance 30 g 1   cetirizine (ZYRTEC) 10 MG tablet Take 10 mg by mouth as needed for allergies.     cholecalciferol (VITAMIN D3) 25 MCG (1000 UNIT) tablet  Take 1,000 Units by mouth daily.     Multiple Vitamin (MULTIVITAMIN PO) Take by mouth.     OIL OF OREGANO PO Take by mouth daily.     terbinafine (LAMISIL) 250 MG tablet Take 1 tablet (250 mg total) by mouth daily. (Patient not taking: Reported on 05/04/2022) 14 tablet 0   No facility-administered medications prior to visit.   Allergies  Allergen Reactions   Yeast Hives   Tetanus Toxoids     Hives, swelling   Pork Allergy Other (See Comments)     Objective:   Today's Vitals   05/04/22 1009  BP: 136/80  Pulse: 76  Temp: 97.6 F (36.4 C)  TempSrc: Temporal  SpO2: 98%  Weight: 257 lb (116.6 kg)  Height: '5\' 5"'$  (1.651 m)   Body mass index is 42.77 kg/m.   General: Well  developed, well nourished. No acute distress. HEENT: Normocephalic, non-traumatic. No pain with tragal manipulation. External ears   normal. EAC mildly swollen and the TM appears thickened, but not red. There is no   perforation or drainage. There is no appreciable soft tissue mass.  Chest: There is a 4-5 mm firm nodule within the skin of the left lateral chest wall, near the   axilla. There is some mild bruising over this. It is freely mobile. Foot: There is an area of peeling skin to the sole of the left foot over most of the forefoot. Psych: Alert and oriented. Normal mood and affect.  Health Maintenance Due  Topic Date Due   INFLUENZA VACCINE  01/26/2022     Assessment & Plan:   1. Nodule of anterior chest wall I continue to feel this is a cyst. We could either continue to observe this, or she could schedule to have the cyst removed. She will think on this and let me know.  2. Tinea pedis, left Continued peeling of the skin of the foot. I offered podiatry referral. She will consider.  3. Chronic otitis externa of left ear, unspecified type The history and appearance of the ear suggests she is having some recurrent external otitis. This may represent and issue with seborrhea with periodic secondary bacterial infection. I will have her try using Cortisporin.  - neomycin-polymyxin-hydrocortisone (CORTISPORIN) OTIC solution; Place 3 drops into the left ear 4 (four) times daily.  Dispense: 10 mL; Refill: 1  4. Class 3 severe obesity due to excess calories without serious comorbidity with body mass index (BMI) of 40.0 to 44.9 in adult [facility]) We discussed approaches to weight loss. I encourage a baseline effort at regular exercise and reduced calories. Medication could be an adjunct to this. We did discuss the risk for regaining weight once off of meds. I offered to assist her with this when she is ready or to refer her to a medical weight loss clinic.  5. Need for influenza vaccination  -  Flu Vaccine QUAD 6+ mos PF IM (Fluarix Quad PF)  Return for Schedule Transfer of Care appointment.   Haydee Salter, MD

## 2022-06-30 ENCOUNTER — Encounter: Payer: Self-pay | Admitting: Family Medicine

## 2022-06-30 ENCOUNTER — Ambulatory Visit: Payer: 59 | Admitting: Family Medicine

## 2022-06-30 VITALS — BP 118/70 | HR 87 | Temp 97.8°F | Ht 65.0 in | Wt 261.8 lb

## 2022-06-30 DIAGNOSIS — R768 Other specified abnormal immunological findings in serum: Secondary | ICD-10-CM | POA: Insufficient documentation

## 2022-06-30 DIAGNOSIS — Z8619 Personal history of other infectious and parasitic diseases: Secondary | ICD-10-CM | POA: Insufficient documentation

## 2022-06-30 DIAGNOSIS — H6062 Unspecified chronic otitis externa, left ear: Secondary | ICD-10-CM | POA: Diagnosis not present

## 2022-06-30 DIAGNOSIS — L659 Nonscarring hair loss, unspecified: Secondary | ICD-10-CM | POA: Insufficient documentation

## 2022-06-30 DIAGNOSIS — E041 Nontoxic single thyroid nodule: Secondary | ICD-10-CM

## 2022-06-30 DIAGNOSIS — C4441 Basal cell carcinoma of skin of scalp and neck: Secondary | ICD-10-CM | POA: Insufficient documentation

## 2022-06-30 DIAGNOSIS — J302 Other seasonal allergic rhinitis: Secondary | ICD-10-CM | POA: Insufficient documentation

## 2022-06-30 DIAGNOSIS — R87610 Atypical squamous cells of undetermined significance on cytologic smear of cervix (ASC-US): Secondary | ICD-10-CM | POA: Insufficient documentation

## 2022-06-30 MED ORDER — NEOMYCIN-POLYMYXIN-HC 3.5-10000-1 OT SOLN: 3.0000 [drp] | Freq: Four times a day (QID) | OTIC | 1 refills | Status: DC

## 2022-06-30 NOTE — Progress Notes (Signed)
[facility] [address] [place] [address] Dept: [phone] Dept Fax: [phone]  Transfer of Care Office Visit  Subjective:    Patient ID: Tina Maldonado, female    DOB: 1980-10-12, 42 y.o..   MRN: [id number]  Chief Complaint  Patient presents with   Establish Care    TOD- establish care.   No concerns.  Not fasting today.      History of Present Illness:  Patient is in today to establish care. Ms. Rosales was born in [place], [place]. She lived for a time near [place]. When she was 59, her family mover to [place], [place]. She attended [employer] with a degree in chemistry. She received a Masters in Building control surveyor from [employer]. 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 She has a SO in [place]. She has a daughter (86). Ms. Chohan denies use of tobacco or drugs and only rarely drinks alcohol.  Ms. Mings has a history of recurrent ear issues. this has primarily involved her left ear. 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 As such she has been very prone to swimmer's ear issues. 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 She finds that no approach seems to work to head off her otitis externa. She manages this with Cortisporin at times when this flares up. She apparently has some mild high frequency hearing loss as well.  Ms. Wilcher has a history of a thyroid nodule. She notes it has been quite some time since she had this assessed. She has a past history of Hashimoto's thyroiditis. She notes this seems to have resovled with time and her TSH has remained normal.  Reviewed other remote history and updated her problem list.  Past Medical History: Patient Active Problem List   Diagnosis Date Noted   Seasonal allergic rhinitis 06/30/2022   Atypical squamous cell changes of undetermined significance (ASCUS)  on cervical cytology with negative high risk human papilloma virus (HPV) test result 06/30/2022   Basal cell carcinoma of scalp 06/30/2022   Alopecia 06/30/2022   History of Rocky Mountain spotted fever 54/00/8676   History of Erlichiosis 19/50/9326   Chronic otitis externa of left ear 06/30/2022   Mild dysplasia of cervix (CIN I) 08/27/2020   History of Hashimoto thyroiditis 08/13/2020   Mixed conductive and sensorineural hearing loss of both ears 11/21/2019   Perforation of left tympanic membrane 11/21/2019   Vitamin D insufficiency 11/06/2018   Class 3 severe obesity due to excess calories without serious comorbidity with body mass index (BMI) of 40.0 to 44.9 in adult ([place]) 11/06/2018   Fatigue 11/06/2018   Arthralgia 11/06/2018   Thyroid nodule 11/06/2018   Past Surgical History:  Procedure Laterality Date   2 colposcopy     CERVICAL BIOPSY  W/ LOOP ELECTRODE EXCISION     COLPOSCOPY     DILATION AND CURETTAGE OF UTERUS     WISDOM TOOTH EXTRACTION     Family History  Problem Relation Age of Onset   Miscarriages / Stillbirths Mother    Thyroid disease Mother    Hypertension Father    Heart disease Father    Thyroid disease Maternal Grandmother    Arthritis Maternal Grandmother    Heart disease Paternal Grandmother    Heart disease Paternal Grandfather    Outpatient Medications Prior to Visit  Medication Sig Dispense Refill   betamethasone valerate  ointment (VALISONE) 0.1 % Use a small amount BID for up to 2 weeks as needed. Not for daily use. You can use it 2 x a week as maintenance 30 g 1   cetirizine (ZYRTEC) 10 MG tablet Take 10 mg by mouth as needed for allergies.     cholecalciferol (VITAMIN D3) 25 MCG (1000 UNIT) tablet Take 1,000 Units by mouth daily.     Multiple Vitamin (MULTIVITAMIN PO) Take by mouth.     OIL OF OREGANO PO Take by mouth daily.     neomycin-polymyxin-hydrocortisone (CORTISPORIN) OTIC solution Place 3 drops into the left ear 4 (four) times daily.  (Patient not taking: Reported on 06/30/2022) 10 mL 1   terbinafine (LAMISIL) 250 MG tablet Take 1 tablet (250 mg total) by mouth daily. (Patient not taking: Reported on 05/04/2022) 14 tablet 0   No facility-administered medications prior to visit.   Allergies  Allergen Reactions   Yeast Hives   Tetanus Toxoids     Hives, swelling   Pork Allergy Other (See Comments)   Objective:   Today's Vitals   06/30/22 0937  BP: 118/70  Pulse: 87  Temp: 97.8 F (36.6 C)  TempSrc: Temporal  SpO2: 97%  Weight: 261 lb 12.8 oz (118.8 kg)  Height: '5\' 5"'$  (1.651 m)   Body mass index is 43.57 kg/m.   General: Well developed, well nourished. No acute distress. Ears: Left TM is chronically scared. No drainage and no obvious perforation at present. Neck: Supple. No lymphadenopathy. Mild fullness int he left thyroid. Psych: Alert and oriented. Normal mood and affect.  There are no preventive care reminders to display for this patient.  Lab Results Last CBC Lab Results  Component Value Date   WBC 9.2 11/11/2021   HGB 14.0 11/11/2021   HCT 41.6 11/11/2021   MCV 87.9 11/11/2021   MCH 29.6 11/11/2021   RDW 12.9 11/11/2021   PLT 445 (H) 85/27/7824   Last metabolic panel Lab Results  Component Value Date   GLUCOSE 93 11/11/2021   NA 138 11/11/2021   K 4.0 11/11/2021   CL 104 11/11/2021   CO2 24 11/11/2021   BUN 11 11/11/2021   CREATININE 0.63 11/11/2021   GFRNONAA 117 08/08/2019   CALCIUM 9.3 11/11/2021   PROT 6.6 11/11/2021   ALBUMIN 4.8 08/27/2020   LABGLOB 2.7 08/08/2019   AGRATIO 1.7 08/08/2019   BILITOT 0.3 11/11/2021   ALKPHOS 75 08/27/2020   AST 12 11/11/2021   ALT 12 11/11/2021   Lab Results  Component Value Date   CHOL 213 (H) 11/11/2021   HDL 58 11/11/2021   LDLCALC 122 (H) 11/11/2021   TRIG 209 (H) 11/11/2021   CHOLHDL 3.7 11/11/2021   Imaging: Thyroid US (10/13/2018) IMPRESSION: Right mid thyroid nodule meets criteria for surveillance, as designated by the newly  established ACR TI-RADS criteria. Surveillance ultrasound study recommended to be performed annually up to 5 years.     Assessment & Plan:   1. Thyroid nodule Ms. Barnard is past due for follow-up of her thyroid nodule. I will place an order for this. She plans to get this done in [place], where she works.  - US THYROID; Future  2. Chronic otitis externa of left ear, unspecified type I will prescribe Cortisporin for use when she has a flare of her otitis externa.  - neomycin-polymyxin-hydrocortisone (CORTISPORIN) OTIC solution; Place 3 drops into the left ear 4 (four) times daily.  Dispense: 10 mL; Refill: 1  Return in about 1 year (  around 07/01/2023) for Annual preventative care.   Haydee Salter, MD

## 2022-11-15 NOTE — Progress Notes (Unsigned)
42 y.o. G1P1001 Significant Other Other or two or more races Not Hispanic or Latino female here for annual exam.  Sexually active, same partner.  Period Cycle (Days): 28 Period Pattern: Regular Menstrual Flow: Moderate Menstrual Control: Tampon, Maxi pad Menstrual Control Change Freq (Hours): 2-3 Dysmenorrhea: (!) Mild Dysmenorrhea Symptoms: Cramping  Patient's last menstrual period was 11/04/2022.          Sexually active: Yes.    The current method of family planning is vasectomy.    Exercising:  yes walking pickle ball  Smoker:  no  Health Maintenance: Pap: 11/11/21 WNL Hr HPV Neg, 11/05/20 ASCUS Hr HPV Neg, 11/08/19 WNL HR HPV Neg, 2/10//21 Neg + HR HPV  History of abnormal Pap:  yes She has had 2 leeps, last in ~2018. Colposcopy from 11/08/19 was unsatisfactory. 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  MMG:  12/31/21 Bi-rads 1 neg (care everywhere)  BMD:   n/a Colonoscopy: 2019 normal  TDaP:  2018  Gardasil: complete    reports that she has never smoked. She has never used smokeless tobacco. She reports that she does not drink alcohol and does not use drugs. Radiology tech, traveling. Daughter with be 16 next month.   Past Medical History:  Diagnosis Date   Cancer (HCC)    basal cell carcinoma   Dysmenorrhea    Hyperlipidemia    Thyroid disease     Past Surgical History:  Procedure Laterality Date   2 colposcopy     CERVICAL BIOPSY  W/ LOOP ELECTRODE EXCISION     COLPOSCOPY     DILATION AND CURETTAGE OF UTERUS     WISDOM TOOTH EXTRACTION      Current Outpatient Medications  Medication Sig Dispense Refill   betamethasone valerate ointment (VALISONE) 0.1 % Use a small amount BID for up to 2 weeks as needed. Not for daily use. You can use it 2 x a week as maintenance 30 g 1   cetirizine (ZYRTEC) 10 MG tablet Take 10 mg by mouth as needed for allergies.     cholecalciferol (VITAMIN D3) 25 MCG (1000 UNIT) tablet Take 1,000 Units by mouth daily.      Multiple Vitamin (MULTIVITAMIN PO) Take by mouth.     neomycin-polymyxin-hydrocortisone (CORTISPORIN) OTIC solution Place 3 drops into the left ear 4 (four) times daily. 10 mL 1   OIL OF OREGANO PO Take by mouth daily.     No current facility-administered medications for this visit.    Family History  Problem Relation Age of Onset   Miscarriages / Stillbirths Mother    Thyroid disease Mother    Hypertension Father    Heart disease Father    Thyroid disease Maternal Grandmother    Arthritis Maternal Grandmother    Heart disease Paternal Grandmother    Heart disease Paternal Grandfather     Review of Systems  All other systems reviewed and are negative.   Exam:   BP 128/74   Pulse 76   Ht 5\' 5"  (1.651 m)   Wt 270 lb (122.5 kg)   LMP 11/04/2022   SpO2 98%   BMI 44.93 kg/m   Weight change: @WEIGHTCHANGE @ Height:   Height: 5\' 5"  (165.1 cm)  Ht Readings from Last 3 Encounters:  11/16/22 5\' 5"  (1.651 m)  06/30/22 5\' 5"  (1.651 m)  05/04/22 5\' 5"  (1.651 m)    General appearance: alert, cooperative and appears stated age Head: Normocephalic, without obvious abnormality, atraumatic Neck: no adenopathy,  supple, symmetrical, trachea midline and thyroid normal to inspection and palpation Lungs: clear to auscultation bilaterally Cardiovascular: regular rate and rhythm Breasts: normal appearance, no masses or tenderness Abdomen: soft, non-tender; non distended,  no masses,  no organomegaly Extremities: extremities normal, atraumatic, no cyanosis or edema Skin: Skin color, texture, turgor normal. No rashes or lesions Lymph nodes: Cervical, supraclavicular, and axillary nodes normal. No abnormal inguinal nodes palpated Neurologic: Grossly normal   Pelvic: External genitalia:  no lesions              Urethra:  normal appearing urethra with no masses, tenderness or lesions              Bartholins and Skenes: normal                 Vagina: normal appearing vagina with normal color  and discharge, no lesions              Cervix: no lesions               Bimanual Exam:  Uterus:  no masses or tenderness              Adnexa: no mass, fullness, tenderness               Rectovaginal: Confirms               Anus:  normal sphincter tone, no lesions  [name], CMA chaperoned for the exam.  1. Well woman exam Discussed breast self exam Discussed calcium and vit D intake Mammogram every 1-2 years Colonoscopy UTD Labs with primary  2. Screening for cervical cancer - Cytology - PAP (patient desires)

## 2022-11-16 ENCOUNTER — Ambulatory Visit (INDEPENDENT_AMBULATORY_CARE_PROVIDER_SITE_OTHER): Payer: 59 | Admitting: Obstetrics and Gynecology

## 2022-11-16 ENCOUNTER — Encounter: Payer: Self-pay | Admitting: Obstetrics and Gynecology

## 2022-11-16 ENCOUNTER — Other Ambulatory Visit (HOSPITAL_COMMUNITY)
Admission: RE | Admit: 2022-11-16 | Discharge: 2022-11-16 | Disposition: A | Discharge status: Home / Self Care | Payer: 59 | Source: Ambulatory Visit | Attending: Obstetrics and Gynecology | Admitting: Obstetrics and Gynecology

## 2022-11-16 ENCOUNTER — Ambulatory Visit: Payer: 59 | Admitting: Family Medicine

## 2022-11-16 VITALS — BP 128/74 | HR 76 | Ht 65.0 in | Wt 270.0 lb

## 2022-11-16 DIAGNOSIS — Z124 Encounter for screening for malignant neoplasm of cervix: Secondary | ICD-10-CM | POA: Insufficient documentation

## 2022-11-16 DIAGNOSIS — Z01419 Encounter for gynecological examination (general) (routine) without abnormal findings: Secondary | ICD-10-CM | POA: Diagnosis not present

## 2022-11-16 NOTE — Patient Instructions (Signed)

## 2022-11-24 LAB — CYTOLOGY - PAP
Comment: NEGATIVE
Diagnosis: NEGATIVE
High risk HPV: NEGATIVE

## 2022-11-25 ENCOUNTER — Telehealth: Payer: Self-pay

## 2022-11-25 NOTE — Telephone Encounter (Signed)
Pap recall for 3 yrs placed

## 2022-12-28 ENCOUNTER — Encounter: Payer: Self-pay | Admitting: Family Medicine

## 2022-12-28 ENCOUNTER — Telehealth (INDEPENDENT_AMBULATORY_CARE_PROVIDER_SITE_OTHER): Payer: 59 | Admitting: Family Medicine

## 2022-12-28 VITALS — Ht 65.0 in | Wt 250.0 lb

## 2022-12-28 DIAGNOSIS — M5442 Lumbago with sciatica, left side: Secondary | ICD-10-CM | POA: Diagnosis not present

## 2022-12-28 DIAGNOSIS — M5441 Lumbago with sciatica, right side: Secondary | ICD-10-CM | POA: Diagnosis not present

## 2022-12-28 MED ORDER — CYCLOBENZAPRINE HCL 10 MG PO TABS: 10.0000 mg | ORAL_TABLET | Freq: Three times a day (TID) | ORAL | 0 refills | Status: DC | PRN

## 2022-12-28 MED ORDER — NAPROXEN 500 MG PO TABS
500.0000 mg | ORAL_TABLET | Freq: Two times a day (BID) | ORAL | 0 refills | Status: AC
Start: 2022-12-28 — End: ?

## 2022-12-28 NOTE — Assessment & Plan Note (Signed)
By history, this appears to be an acute low back muscle strain. I reassured her that 80% of such cases resolve within 4-6 weeks. Imaging would not be indicated. She should continue her symptomatic treatment. Iw ill add a prescription dose of naproxen and Flexeril for comfort. She should try and be up and around moving. I recommend she stay off work through this weekend. If not improving by next week, she should try and see me in the office.

## 2022-12-28 NOTE — Progress Notes (Signed)
[facility] [address] [place] [address] Dept: [phone] Dept Fax: [phone]  Virtual Video Visit  I connected with Inocencio Homes on 12/28/22 at  8:40 AM EDT by a video enabled telemedicine application and verified that I am speaking with the correct person using two identifiers.  Location patient: Home Location provider: Clinic Persons participating in the virtual visit: Patient, Provider  I discussed the limitations of evaluation and management by telemedicine and the availability of in person appointments. The patient expressed understanding and agreed to proceed.  Chief Complaint  Patient presents with   Back Pain    C/o having back pain x 3 days.  She has been in bed for 3 days.  Has been taking Aleve and pain patches.   SUBJECTIVE:  HPI: Tina Maldonado is a 42 y.o. female who presents with acute onset of low back pain on Sunday (12/26/2022). She notes she was fine when she awakened. She went outside and was flattening some dirt with her hands. When she stood back up, she "felt like stuff got ripped off". Since then, she notes she has largely been in the bed, in too much pain to be up and around. She notes the pain is in the lower back, but radiates down both buttocks and legs. She denies any new areas of numbness, tingling, or weakness. She has not had any new incontinence of bowel or bladder. She has taken naproxen 220 mg twice a day, been using lidocaine patches, and applying creams for her pain. As well, she has tried using a back brace. She is scheduled to return to work tomorrow through Saturday.  Patient Active Problem List   Diagnosis Date Noted   Seasonal allergic rhinitis 06/30/2022   Atypical squamous cell changes of undetermined significance (ASCUS) on cervical cytology with negative high risk human papilloma virus (HPV) test result 06/30/2022   Basal cell carcinoma of scalp 06/30/2022   Alopecia  06/30/2022   History of Rocky Mountain spotted fever 06/30/2022   History of Erlichiosis 06/30/2022   Chronic otitis externa of left ear 06/30/2022   Mild dysplasia of cervix (CIN I) 08/27/2020   History of Hashimoto thyroiditis 08/13/2020   Mixed conductive and sensorineural hearing loss of both ears 11/21/2019   Perforation of left tympanic membrane 11/21/2019   Vitamin D insufficiency 11/06/2018   Class 3 severe obesity due to excess calories without serious comorbidity with body mass index (BMI) of 40.0 to 44.9 in adult (HCC) 11/06/2018   Fatigue 11/06/2018   Arthralgia 11/06/2018   Thyroid nodule 11/06/2018   Past Surgical History:  Procedure Laterality Date   2 colposcopy     CERVICAL BIOPSY  W/ LOOP ELECTRODE EXCISION     COLPOSCOPY     DILATION AND CURETTAGE OF UTERUS     WISDOM TOOTH EXTRACTION     Family History  Problem Relation Age of Onset   Miscarriages / Stillbirths Mother    Thyroid disease Mother    Hypertension Father    Heart disease Father    Thyroid disease Maternal Grandmother    Arthritis Maternal Grandmother    Heart disease Paternal Grandmother    Heart disease Paternal Grandfather    Social History   Tobacco Use   Smoking status: Never   Smokeless tobacco: Never  Vaping Use   Vaping Use: Never used  Substance Use Topics   Alcohol use: Never   Drug use: Never    Current Outpatient Medications:  cetirizine (ZYRTEC) 10 MG tablet, Take 10 mg by mouth as needed for allergies., Disp: , Rfl:    cholecalciferol (VITAMIN D3) 25 MCG (1000 UNIT) tablet, Take 1,000 Units by mouth daily., Disp: , Rfl:    Multiple Vitamin (MULTIVITAMIN PO), Take by mouth., Disp: , Rfl:    neomycin-polymyxin-hydrocortisone (CORTISPORIN) OTIC solution, Place 3 drops into the left ear 4 (four) times daily., Disp: 10 mL, Rfl: 1   OIL OF OREGANO PO, Take by mouth daily., Disp: , Rfl:    betamethasone valerate ointment (VALISONE) 0.1 %, Use a small amount BID for up to 2  weeks as needed. Not for daily use. You can use it 2 x a week as maintenance (Patient not taking: Reported on 12/28/2022), Disp: 30 g, Rfl: 1 Allergies  Allergen Reactions   Yeast Hives   Tetanus Toxoids     Hives, swelling   Pork Allergy Other (See Comments)   ROS: See pertinent positives and negatives per HPI.  OBSERVATIONS/OBJECTIVE:  VITALS per patient if applicable: Today's Vitals   12/28/22 0830  Weight: 250 lb (113.4 kg)  Height: 5\' 5"  (1.651 m)   Body mass index is 41.6 kg/m.   GENERAL: Alert and oriented. Appears well and in no acute distress. PSYCH/NEURO: Pleasant and cooperative. No obvious depression or anxiety. Speech and thought processing grossly intact.  ASSESSMENT AND PLAN:  Problem List Items Addressed This Visit       Nervous and Auditory   Acute midline low back pain with bilateral sciatica - Primary    By history, this appears to be an acute low back muscle strain. I reassured her that 80% of such cases resolve within 4-6 weeks. Imaging would not be indicated. She should continue her symptomatic treatment. Iw ill add a prescription dose of naproxen and Flexeril for comfort. She should try and be up and around moving. I recommend she stay off work through this weekend. If not improving by next week, she should try and see me in the office.      Relevant Medications   cyclobenzaprine (FLEXERIL) 10 MG tablet   naproxen (NAPROSYN) 500 MG tablet   I discussed the assessment and treatment plan with the patient. The patient was provided an opportunity to ask questions and all were answered. The patient agreed with the plan and demonstrated an understanding of the instructions.   The patient was advised to call back or seek an in-person evaluation if the symptoms worsen or if the condition fails to improve as anticipated.  Return in about 1 week (around 01/04/2023), or if symptoms worsen or fail to improve.   Loyola Mast, MD

## 2023-01-03 ENCOUNTER — Encounter: Payer: Self-pay | Admitting: Family Medicine

## 2023-02-16 DIAGNOSIS — Z1231 Encounter for screening mammogram for malignant neoplasm of breast: Secondary | ICD-10-CM | POA: Diagnosis not present

## 2023-02-16 LAB — HM MAMMOGRAPHY

## 2023-07-05 ENCOUNTER — Encounter: Payer: 59 | Admitting: Family Medicine

## 2023-07-14 ENCOUNTER — Ambulatory Visit: Payer: 59 | Admitting: Family Medicine

## 2023-07-18 ENCOUNTER — Encounter: Payer: 59 | Admitting: Family Medicine

## 2023-10-25 ENCOUNTER — Encounter: Payer: Self-pay | Admitting: Family Medicine

## 2024-02-09 HISTORY — PX: MOHS SURGERY: SHX181

## 2024-02-15 ENCOUNTER — Ambulatory Visit: Payer: Self-pay | Admitting: Family Medicine

## 2024-02-15 ENCOUNTER — Ambulatory Visit (INDEPENDENT_AMBULATORY_CARE_PROVIDER_SITE_OTHER): Payer: Self-pay | Admitting: Family Medicine

## 2024-02-15 ENCOUNTER — Encounter: Payer: Self-pay | Admitting: Family Medicine

## 2024-02-15 VITALS — BP 130/74 | HR 76 | Temp 97.4°F | Ht 65.0 in | Wt 268.2 lb

## 2024-02-15 DIAGNOSIS — E041 Nontoxic single thyroid nodule: Secondary | ICD-10-CM | POA: Diagnosis not present

## 2024-02-15 DIAGNOSIS — C4441 Basal cell carcinoma of skin of scalp and neck: Secondary | ICD-10-CM | POA: Diagnosis not present

## 2024-02-15 DIAGNOSIS — E559 Vitamin D deficiency, unspecified: Secondary | ICD-10-CM | POA: Diagnosis not present

## 2024-02-15 DIAGNOSIS — E785 Hyperlipidemia, unspecified: Secondary | ICD-10-CM | POA: Diagnosis not present

## 2024-02-15 DIAGNOSIS — Z8639 Personal history of other endocrine, nutritional and metabolic disease: Secondary | ICD-10-CM | POA: Diagnosis not present

## 2024-02-15 DIAGNOSIS — H60332 Swimmer's ear, left ear: Secondary | ICD-10-CM

## 2024-02-15 LAB — LIPID PANEL
Cholesterol: 247 mg/dL — ABNORMAL HIGH (ref 0–200)
HDL: 53.9 mg/dL (ref >39.00)
LDL Cholesterol: 150 mg/dL — ABNORMAL HIGH (ref 0–99)
NonHDL: 193.29
Total CHOL/HDL Ratio: 5
Triglycerides: 215 mg/dL — ABNORMAL HIGH (ref 0.0–149.0)
VLDL: 43 mg/dL — ABNORMAL HIGH (ref 0.0–40.0)

## 2024-02-15 LAB — VITAMIN D 25 HYDROXY (VIT D DEFICIENCY, FRACTURES): VITD: 23.77 ng/mL — ABNORMAL LOW (ref 30.00–100.00)

## 2024-02-15 LAB — TSH: TSH: 1.96 u[IU]/mL (ref 0.35–5.50)

## 2024-02-15 LAB — T4, FREE: Free T4: 0.81 ng/dL (ref 0.60–1.60)

## 2024-02-15 MED ORDER — VITAMIN D (ERGOCALCIFEROL) 1.25 MG (50000 UNIT) PO CAPS: 50000.0000 [IU] | ORAL_CAPSULE | ORAL | 0 refills | Status: AC

## 2024-02-15 MED ORDER — NEOMYCIN-POLYMYXIN-HC 3.5-10000-1 OT SOLN
3.0000 [drp] | Freq: Four times a day (QID) | OTIC | 2 refills | Status: AC
Start: 2024-02-15 — End: ?

## 2024-02-15 NOTE — Assessment & Plan Note (Signed)
 Wound appears to be healing well. Follow-up with dermatology as scheduled.

## 2024-02-15 NOTE — Assessment & Plan Note (Signed)
 I will reassess TSH and T4 today.

## 2024-02-15 NOTE — Assessment & Plan Note (Signed)
 Left outer ear infection. I will prescribe Cortisporin drops for her to use.

## 2024-02-15 NOTE — Progress Notes (Signed)
 [facility] [address] [place] [address] Dept: [phone] Dept Fax: [phone]  Chronic Care Office Visit  Subjective:    Patient ID: Tina Maldonado, female    DOB: 09-13-1980, 43 y.o..   MRN: [id number]  Chief Complaint  Patient presents with   Thyroid  Problem   History of Present Illness:  Patient is in today for reassessment of chronic medical issues.  Ms. Puskarich has a history of a thyroid  nodule. She also has a past history of Hashimoto's thyroiditis. I ahd referred her in Jan 2024 for a thyroid  ultrasound, but she has not followed up for this.  Ms. Jelinek recently had a Moh's procedure of her scalp related to a recurrent basal cell carcinoma. She currently has staples in place. She will see her dermatologist back in 2 days.  Ms. Wilinski has a history of recurrent ear issues. This has primarily involved her left ear. She has been a Patent examiner. As such she has been very prone to swimmer's ear issues. 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 She finds that no approach seems to work to head off her otitis externa. She manages this with Cortisporin at times when this flares up. She is currently trainign up for a 2 miles swim on [place] in Sept.  Past Medical History: Patient Active Problem List   Diagnosis Date Noted   Borderline hyperlipidemia 02/15/2024   Acute midline low back pain with bilateral sciatica 12/28/2022   Seasonal allergic rhinitis 06/30/2022   Atypical squamous cell changes of undetermined significance (ASCUS) on cervical cytology with negative high risk human papilloma virus (HPV) test result 06/30/2022   Basal cell carcinoma of scalp 06/30/2022   Alopecia 06/30/2022   History of Rocky Mountain spotted fever 06/30/2022   History of Erlichiosis 06/30/2022   Chronic otitis externa of left ear 06/30/2022   Mild dysplasia of cervix (CIN I) 08/27/2020   History of Hashimoto thyroiditis  08/13/2020   Mixed conductive and sensorineural hearing loss of both ears 11/21/2019   Perforation of left tympanic membrane 11/21/2019   Vitamin D  insufficiency 11/06/2018   Class 3 severe obesity due to excess calories without serious comorbidity with body mass index (BMI) of 40.0 to 44.9 in adult 11/06/2018   Fatigue 11/06/2018   Arthralgia 11/06/2018   Thyroid  nodule 11/06/2018   Past Surgical History:  Procedure Laterality Date   2 colposcopy     CERVICAL BIOPSY  W/ LOOP ELECTRODE EXCISION     COLPOSCOPY     DILATION AND CURETTAGE OF UTERUS     MOHS SURGERY  02/09/2024   WISDOM TOOTH EXTRACTION     Family History  Problem Relation Age of Onset   Miscarriages / Stillbirths Mother    Thyroid  disease Mother    Hypertension Father    Heart disease Father    Thyroid  disease Maternal Grandmother    Arthritis Maternal Grandmother    Heart disease Paternal Grandmother    Heart disease Paternal Grandfather    Outpatient Medications Prior to Visit  Medication Sig Dispense Refill   cetirizine (ZYRTEC) 10 MG tablet Take 10 mg by mouth as needed for allergies.     cholecalciferol (VITAMIN D3) 25 MCG (1000 UNIT) tablet Take 1,000 Units by mouth daily.     Multiple Vitamin (MULTIVITAMIN PO) Take by mouth.     naproxen  (NAPROSYN ) 500 MG tablet Take 1 tablet (500 mg total) by mouth 2 (two) times daily with a meal. 30  tablet 0   OIL OF OREGANO PO Take by mouth daily.     betamethasone  valerate ointment (VALISONE ) 0.1 % Use a small amount BID for up to 2 weeks as needed. Not for daily use. You can use it 2 x a week as maintenance (Patient not taking: Reported on 12/28/2022) 30 g 1   cyclobenzaprine  (FLEXERIL ) 10 MG tablet Take 1 tablet (10 mg total) by mouth 3 (three) times daily as needed for muscle spasms. 30 tablet 0   neomycin -polymyxin-hydrocortisone (CORTISPORIN) OTIC solution Place 3 drops into the left ear 4 (four) times daily. 10 mL 1   No facility-administered medications prior to  visit.   Allergies  Allergen Reactions   Yeast Hives   Pertussis Vaccines Itching and Swelling   Tetanus Toxoids     Hives, swelling   Pork Allergy Other (See Comments)   Objective:   Today's Vitals   02/15/24 1312  BP: 130/74  Pulse: 76  Temp: (!) 97.4 F (36.3 C)  TempSrc: Temporal  SpO2: 98%  Weight: 268 lb 3.2 oz (121.7 kg)  Height: 5' 5 (1.651 m)   Body mass index is 44.63 kg/m.   General: Well developed, well nourished. No acute distress. HEENT: Normocephalic. Surgical wound to scalp with staples in place. Wound has mild surrounding   erythema and mild swelling of the anterior end, but no sign of purulent drainage.  Left external ear   normal. EAC with purulent drainage and slough. No bubbling with valsalva.  Psych: Alert and oriented. Normal mood and affect.  Health Maintenance Due  Topic Date Due   HPV VACCINES (1 - Risk 3-dose SCDM series) Never done     Assessment & Plan:   Problem List Items Addressed This Visit       Endocrine   Thyroid  nodule   Prior history of a right mid-thyroid  nodule. Had previously been recommended to have annual monitoring of this for 5 years, but has not followed up on this. I recommend we do an ultrasound at this point.      Relevant Orders   US  THYROID      Nervous and Auditory   Chronic otitis externa of left ear   Left outer ear infection. I will prescribe Cortisporin drops for her to use.      Relevant Medications   neomycin -polymyxin-hydrocortisone (CORTISPORIN) OTIC solution     Musculoskeletal and Integument   Basal cell carcinoma of scalp - Primary   Wound appears to be healing well. Follow-up with dermatology as scheduled.        Other   Borderline hyperlipidemia   I will recheck her lipid levels.      Relevant Orders   Lipid panel (Completed)   History of Hashimoto thyroiditis   I will reassess TSH and T4 today.      Relevant Orders   TSH (Completed)   T4, free (Completed)   Vitamin D   insufficiency   I will reassess Vitamin D  levels today.      Relevant Orders   VITAMIN D  25 Hydroxy (Vit-D Deficiency, Fractures) (Completed)    Return in about 1 year (around 02/14/2025) for Annual preventative care.   Garnette CHRISTELLA Simpler, MD

## 2024-02-15 NOTE — Assessment & Plan Note (Signed)
 I will recheck her lipid levels.

## 2024-02-15 NOTE — Assessment & Plan Note (Signed)
 I will reassess Vitamin D  levels today.

## 2024-02-15 NOTE — Assessment & Plan Note (Signed)
 Prior history of a right mid-thyroid  nodule. Had previously been recommended to have annual monitoring of this for 5 years, but has not followed up on this. I recommend we do an ultrasound at this point.
